# Patient Record
Sex: Male | Born: 1965
Health system: Southern US, Community
[De-identification: ages and names within clinical notes are randomized; demographics above are authoritative.]

## PROBLEM LIST (undated history)

## (undated) ENCOUNTER — Emergency Department (HOSPITAL_COMMUNITY): Discharge status: Against Medical Advice | Payer: Self-pay

## (undated) DIAGNOSIS — E785 Hyperlipidemia, unspecified: Secondary | ICD-10-CM

## (undated) DIAGNOSIS — K760 Fatty (change of) liver, not elsewhere classified: Secondary | ICD-10-CM

## (undated) DIAGNOSIS — Z8489 Family history of other specified conditions: Secondary | ICD-10-CM

## (undated) DIAGNOSIS — I10 Essential (primary) hypertension: Secondary | ICD-10-CM

## (undated) DIAGNOSIS — M109 Gout, unspecified: Secondary | ICD-10-CM

## (undated) HISTORY — DX: Essential (primary) hypertension: I10

## (undated) HISTORY — DX: Hyperlipidemia, unspecified: E78.5

## (undated) HISTORY — DX: Fatty (change of) liver, not elsewhere classified: K76.0

## (undated) HISTORY — PX: OTHER SURGICAL HISTORY: SHX169

## (undated) HISTORY — DX: Gout, unspecified: M10.9

---
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
## 1997-03-07 HISTORY — PX: WISDOM TOOTH EXTRACTION: SHX21

## 2013-09-25 ENCOUNTER — Ambulatory Visit: Payer: Self-pay | Admitting: Podiatrist

## 2015-07-29 ENCOUNTER — Telehealth: Payer: Self-pay | Admitting: Internal Medicine

## 2015-07-29 NOTE — Telephone Encounter (Signed)
Unfortunately, I'm not able to accept any more new patients at this time.  I'm sorry! Thank you!  

## 2015-07-29 NOTE — Telephone Encounter (Signed)
Pt requste to be Dr. Camila Li new pt, [facility], pt stated Joseph Livingston refer him. Advise pt that Dr. Camila Li is not taking new pt at the moment but we will ask. Please advise.

## 2015-07-30 NOTE — Telephone Encounter (Signed)
Pt aware.

## 2015-10-06 ENCOUNTER — Telehealth: Payer: Self-pay

## 2015-10-06 NOTE — Telephone Encounter (Signed)
Unfortunately, I'm not able to accept any more new patients at this time.  I'm sorry! Thank you!  

## 2015-10-06 NOTE — Telephone Encounter (Signed)
Joseph Livingston is calling wanting to know if you would accept him as new patient----has several friends that see you now and recommended you to him---routing to dr plotnikov, please advise if you are ok with this, I will call patient back, thanks

## 2015-10-07 NOTE — Telephone Encounter (Signed)
Advised patient of dr plotnikovs response, patient can call back and choose another doctor here in our office if he wants to, patient would like to think about it and call back if he decides to go with this practice

## 2015-11-13 ENCOUNTER — Telehealth: Payer: Self-pay | Admitting: Internal Medicine

## 2015-11-13 NOTE — Telephone Encounter (Signed)
Ok Thx 

## 2015-11-13 NOTE — Telephone Encounter (Signed)
States that he is a friend of Yetta Barre.  States Dr. Camila Li told Audry Pili when he was last in to see Dr. Camila Li that Dr. Camila Li said he would take him on as a patient.  Please advise.

## 2015-11-16 NOTE — Telephone Encounter (Signed)
Got patient scheduled

## 2015-11-25 ENCOUNTER — Ambulatory Visit (INDEPENDENT_AMBULATORY_CARE_PROVIDER_SITE_OTHER): Payer: Commercial Managed Care - HMO | Admitting: Internal Medicine

## 2015-11-25 ENCOUNTER — Other Ambulatory Visit (INDEPENDENT_AMBULATORY_CARE_PROVIDER_SITE_OTHER): Payer: Commercial Managed Care - HMO

## 2015-11-25 ENCOUNTER — Encounter: Payer: Self-pay | Admitting: Internal Medicine

## 2015-11-25 VITALS — BP 140/80 | HR 79 | Ht 66.0 in | Wt 188.0 lb

## 2015-11-25 DIAGNOSIS — F4321 Adjustment disorder with depressed mood: Secondary | ICD-10-CM

## 2015-11-25 DIAGNOSIS — R7989 Other specified abnormal findings of blood chemistry: Secondary | ICD-10-CM

## 2015-11-25 DIAGNOSIS — E559 Vitamin D deficiency, unspecified: Secondary | ICD-10-CM

## 2015-11-25 DIAGNOSIS — Z0000 Encounter for general adult medical examination without abnormal findings: Secondary | ICD-10-CM

## 2015-11-25 DIAGNOSIS — R972 Elevated prostate specific antigen [PSA]: Secondary | ICD-10-CM

## 2015-11-25 DIAGNOSIS — R945 Abnormal results of liver function studies: Secondary | ICD-10-CM

## 2015-11-25 DIAGNOSIS — I10 Essential (primary) hypertension: Secondary | ICD-10-CM | POA: Diagnosis not present

## 2015-11-25 DIAGNOSIS — E785 Hyperlipidemia, unspecified: Secondary | ICD-10-CM

## 2015-11-25 LAB — BASIC METABOLIC PANEL
BUN: 17 mg/dL (ref 6–23)
CHLORIDE: 95 meq/L — AB (ref 96–112)
CO2: 31 meq/L (ref 19–32)
Calcium: 10.2 mg/dL (ref 8.4–10.5)
Creatinine, Ser: 1.15 mg/dL (ref 0.40–1.50)
GFR: 86.75 mL/min (ref >60.00)
GLUCOSE: 107 mg/dL — AB (ref 70–99)
POTASSIUM: 4.7 meq/L (ref 3.5–5.1)
Sodium: 133 mEq/L — ABNORMAL LOW (ref 135–145)

## 2015-11-25 LAB — HEPATIC FUNCTION PANEL
ALT: 59 U/L — AB (ref 0–53)
AST: 47 U/L — AB (ref 0–37)
Albumin: 4.8 g/dL (ref 3.5–5.2)
Alkaline Phosphatase: 48 U/L (ref 39–117)
BILIRUBIN DIRECT: 0.1 mg/dL (ref 0.0–0.3)
BILIRUBIN TOTAL: 1.2 mg/dL (ref 0.2–1.2)
Total Protein: 8 g/dL (ref 6.0–8.3)

## 2015-11-25 LAB — LIPID PANEL
CHOLESTEROL: 369 mg/dL — AB (ref 0–200)
HDL: 55.7 mg/dL (ref >39.00)
Total CHOL/HDL Ratio: 7
Triglycerides: 433 mg/dL — ABNORMAL HIGH (ref 0.0–149.0)

## 2015-11-25 LAB — CBC WITH DIFFERENTIAL/PLATELET
BASOS PCT: 0.7 % (ref 0.0–3.0)
Basophils Absolute: 0 10*3/uL (ref 0.0–0.1)
EOS PCT: 1.7 % (ref 0.0–5.0)
Eosinophils Absolute: 0.1 10*3/uL (ref 0.0–0.7)
HCT: 41.4 % (ref 39.0–52.0)
Hemoglobin: 14.6 g/dL (ref 13.0–17.0)
LYMPHS ABS: 1.5 10*3/uL (ref 0.7–4.0)
Lymphocytes Relative: 34 % (ref 12.0–46.0)
MCHC: 35.3 g/dL (ref 30.0–36.0)
MCV: 87.6 fl (ref 78.0–100.0)
MONO ABS: 0.4 10*3/uL (ref 0.1–1.0)
MONOS PCT: 9 % (ref 3.0–12.0)
NEUTROS ABS: 2.4 10*3/uL (ref 1.4–7.7)
NEUTROS PCT: 54.6 % (ref 43.0–77.0)
PLATELETS: 236 10*3/uL (ref 150.0–400.0)
RBC: 4.73 Mil/uL (ref 4.22–5.81)
RDW: 13.2 % (ref 11.5–15.5)
WBC: 4.4 10*3/uL (ref 4.0–10.5)

## 2015-11-25 LAB — URINALYSIS
Bilirubin Urine: NEGATIVE
Hgb urine dipstick: NEGATIVE
KETONES UR: NEGATIVE
LEUKOCYTES UA: NEGATIVE
NITRITE: NEGATIVE
PH: 6.5 (ref 5.0–8.0)
SPECIFIC GRAVITY, URINE: 1.01 (ref 1.000–1.030)
Total Protein, Urine: NEGATIVE
UROBILINOGEN UA: 0.2 (ref 0.0–1.0)
Urine Glucose: NEGATIVE

## 2015-11-25 LAB — TSH: TSH: 1.14 u[IU]/mL (ref 0.35–4.50)

## 2015-11-25 LAB — LDL CHOLESTEROL, DIRECT: LDL DIRECT: 225 mg/dL

## 2015-11-25 LAB — PSA: PSA: 9.61 ng/mL — AB (ref 0.10–4.00)

## 2015-11-25 MED ORDER — VITAMIN D3 50 MCG (2000 UT) PO CAPS: 2000.0000 [IU] | ORAL_CAPSULE | Freq: Every day | ORAL | 3 refills | Status: AC

## 2015-11-25 NOTE — Assessment & Plan Note (Signed)
We discussed age appropriate health related issues, including available/recomended screening tests and vaccinations. We discussed a need for adhering to healthy diet and exercise. Labs/EKG were reviewed/ordered. All questions were answered.   

## 2015-11-25 NOTE — Assessment & Plan Note (Signed)
?  vytorin or Crestor related Will re-check

## 2015-11-25 NOTE — Assessment & Plan Note (Signed)
Free psa

## 2015-11-25 NOTE — Assessment & Plan Note (Addendum)
Statin intolerant Severe On Lovaza

## 2015-11-25 NOTE — Progress Notes (Signed)
   Subjective:  Patient ID: Joseph Livingston, male    DOB: Mar 21, 1965  Age: 50 y.o. MRN: [id number]  CC: Establish Care   HPI BORIS ANGLE presents for a well exam - new pt He is grieving his dad's death  No outpatient prescriptions prior to visit.   No facility-administered medications prior to visit.     ROS Review of Systems  Constitutional: Negative for appetite change, fatigue and unexpected weight change.  HENT: Negative for congestion, nosebleeds, sneezing, sore throat and trouble swallowing.   Eyes: Negative for itching and visual disturbance.  Respiratory: Negative for cough.   Cardiovascular: Negative for chest pain, palpitations and leg swelling.  Gastrointestinal: Negative for abdominal distention, blood in stool, diarrhea and nausea.  Genitourinary: Negative for frequency and hematuria.  Musculoskeletal: Negative for back pain, gait problem, joint swelling and neck pain.  Skin: Negative for rash.  Neurological: Negative for dizziness, tremors, speech difficulty and weakness.  Psychiatric/Behavioral: Positive for sleep disturbance. Negative for agitation and dysphoric mood. The patient is not nervous/anxious.     Objective:  BP 140/80   Pulse 79   Ht 5\' 6"  (1.676 m)   Wt 188 lb (85.3 kg)   SpO2 99%   BMI 30.34 kg/m   BP Readings from Last 3 Encounters:  11/25/15 140/80    Wt Readings from Last 3 Encounters:  11/25/15 188 lb (85.3 kg)    Physical Exam  Constitutional: He is oriented to person, place, and time. He appears well-developed. No distress.  NAD  HENT:  Mouth/Throat: Oropharynx is clear and moist.  Eyes: Conjunctivae are normal. Pupils are equal, round, and reactive to light.  Neck: Normal range of motion. No JVD present. No thyromegaly present.  Cardiovascular: Normal rate, regular rhythm, normal heart sounds and intact distal pulses.  Exam reveals no gallop and no friction rub.   No murmur heard. Pulmonary/Chest: Effort normal  and breath sounds normal. No respiratory distress. He has no wheezes. He has no rales. He exhibits no tenderness.  Abdominal: Soft. Bowel sounds are normal. He exhibits no distension and no mass. There is no tenderness. There is no rebound and no guarding.  Musculoskeletal: Normal range of motion. He exhibits no edema or tenderness.  Lymphadenopathy:    He has no cervical adenopathy.  Neurological: He is alert and oriented to person, place, and time. He has normal reflexes. No cranial nerve deficit. He exhibits normal muscle tone. He displays a negative Romberg sign. Coordination and gait normal.  Skin: Skin is warm and dry. No rash noted.  Psychiatric: He has a normal mood and affect. His behavior is normal. Judgment and thought content normal.  Rectal - pt declined  No results found for: WBC, HGB, HCT, PLT, GLUCOSE, CHOL, TRIG, HDL, LDLDIRECT, LDLCALC, ALT, AST, NA, K, CL, CREATININE, BUN, CO2, TSH, PSA, INR, GLUF, HGBA1C, MICROALBUR  Patient was never admitted.  Assessment & Plan:   There are no diagnoses linked to this encounter. I am having Mr. Luiz maintain his TEKTURNA HCT, MELATONIN PO, and omega-3 acid ethyl esters.  Meds ordered this encounter  Medications  . TEKTURNA HCT 300-12.5 MG TABS    Sig: Take 1 tablet by mouth daily.  Marland Kitchen MELATONIN PO    Sig: Take by mouth at bedtime.  Marland Kitchen omega-3 acid ethyl esters (LOVAZA) 1 g capsule    Sig: Take 2 g by mouth 2 (two) times daily.     Follow-up: No Follow-up on file.  Walker Kehr, MD

## 2015-11-25 NOTE — Assessment & Plan Note (Signed)
On Tekturna HCT Labs

## 2015-11-25 NOTE — Progress Notes (Signed)
Pre visit review using our clinic review tool, if applicable. No additional management support is needed unless otherwise documented below in the visit note. 

## 2015-11-25 NOTE — Assessment & Plan Note (Addendum)
Dad died in 2016 Discussed - pt declined help at the moment

## 2015-11-26 ENCOUNTER — Telehealth: Payer: Self-pay

## 2015-11-26 DIAGNOSIS — E559 Vitamin D deficiency, unspecified: Secondary | ICD-10-CM | POA: Insufficient documentation

## 2015-11-26 LAB — VITAMIN D 25 HYDROXY (VIT D DEFICIENCY, FRACTURES): VITD: 22.49 ng/mL — ABNORMAL LOW (ref 30.00–100.00)

## 2015-11-26 MED ORDER — ERGOCALCIFEROL 1.25 MG (50000 UT) PO CAPS: 50000.0000 [IU] | ORAL_CAPSULE | ORAL | 0 refills | Status: DC

## 2015-11-26 NOTE — Telephone Encounter (Signed)
Yes pls Thx 

## 2015-11-26 NOTE — Telephone Encounter (Signed)
Recd call from karen/lab----specimen too old for add-on labs entered----routing to dr plotnikov----do you want to have patient come back and reorder new labs

## 2015-11-26 NOTE — Addendum Note (Signed)
Addended by: Cassandria Anger on: 11/26/2015 08:18 PM   Modules accepted: Orders

## 2015-11-26 NOTE — Assessment & Plan Note (Signed)
start Vit D prescription 50000 iu weekly (Rx emailed to your pharmacy) followed by over-the-counter Vit D 2000 iu daily.  

## 2015-11-27 NOTE — Telephone Encounter (Signed)
Stacy/cma to handle

## 2016-01-27 ENCOUNTER — Encounter: Payer: Self-pay | Admitting: Internal Medicine

## 2016-01-27 ENCOUNTER — Ambulatory Visit (INDEPENDENT_AMBULATORY_CARE_PROVIDER_SITE_OTHER): Payer: Commercial Managed Care - HMO | Admitting: Internal Medicine

## 2016-01-27 ENCOUNTER — Other Ambulatory Visit (INDEPENDENT_AMBULATORY_CARE_PROVIDER_SITE_OTHER): Payer: Commercial Managed Care - HMO

## 2016-01-27 DIAGNOSIS — R972 Elevated prostate specific antigen [PSA]: Secondary | ICD-10-CM

## 2016-01-27 DIAGNOSIS — R7989 Other specified abnormal findings of blood chemistry: Secondary | ICD-10-CM

## 2016-01-27 DIAGNOSIS — E559 Vitamin D deficiency, unspecified: Secondary | ICD-10-CM

## 2016-01-27 DIAGNOSIS — I10 Essential (primary) hypertension: Secondary | ICD-10-CM | POA: Diagnosis not present

## 2016-01-27 DIAGNOSIS — R945 Abnormal results of liver function studies: Secondary | ICD-10-CM

## 2016-01-27 DIAGNOSIS — F4321 Adjustment disorder with depressed mood: Secondary | ICD-10-CM

## 2016-01-27 DIAGNOSIS — F4320 Adjustment disorder, unspecified: Secondary | ICD-10-CM

## 2016-01-27 LAB — HEPATITIS B SURFACE ANTIGEN: Hepatitis B Surface Ag: NEGATIVE

## 2016-01-27 LAB — LDL CHOLESTEROL, DIRECT: LDL DIRECT: 242 mg/dL

## 2016-01-27 LAB — HEPATIC FUNCTION PANEL
ALBUMIN: 5.3 g/dL — AB (ref 3.5–5.2)
ALK PHOS: 63 U/L (ref 39–117)
ALT: 97 U/L — ABNORMAL HIGH (ref 0–53)
AST: 61 U/L — ABNORMAL HIGH (ref 0–37)
BILIRUBIN DIRECT: 0.1 mg/dL (ref 0.0–0.3)
TOTAL PROTEIN: 9 g/dL — AB (ref 6.0–8.3)
Total Bilirubin: 1 mg/dL (ref 0.2–1.2)

## 2016-01-27 LAB — LIPID PANEL
CHOL/HDL RATIO: 7
Cholesterol: 401 mg/dL — ABNORMAL HIGH (ref 0–200)
HDL: 58.2 mg/dL (ref >39.00)

## 2016-01-27 LAB — IBC PANEL
IRON: 104 ug/dL (ref 42–165)
SATURATION RATIOS: 22.9 % (ref 20.0–50.0)
TRANSFERRIN: 325 mg/dL (ref 212.0–360.0)

## 2016-01-27 LAB — HEPATITIS B SURFACE ANTIBODY,QUALITATIVE: Hep B S Ab: NEGATIVE

## 2016-01-27 LAB — HEPATITIS C ANTIBODY: HCV Ab: NEGATIVE

## 2016-01-27 NOTE — Progress Notes (Signed)
Pre visit review using our clinic review tool, if applicable. No additional management support is needed unless otherwise documented below in the visit note. 

## 2016-01-27 NOTE — Assessment & Plan Note (Signed)
Discussed.

## 2016-01-27 NOTE — Assessment & Plan Note (Signed)
Liver US; labs

## 2016-01-27 NOTE — Progress Notes (Signed)
Subjective:  Patient ID: Joseph Livingston, male    DOB: 1965/08/06  Age: 50 y.o. MRN: [id number]  CC: No chief complaint on file.   HPI Joseph Livingston presents for elevated PSA, elevated LFTs, elevated lipids, and vit D def f/u.   Outpatient Medications Prior to Visit  Medication Sig Dispense Refill  . Cholecalciferol (VITAMIN D3) 2000 units capsule Take 1 capsule (2,000 Units total) by mouth daily. 100 capsule 3  . MELATONIN PO Take by mouth at bedtime.    Marland Kitchen omega-3 acid ethyl esters (LOVAZA) 1 g capsule Take 2 g by mouth 2 (two) times daily.    Marisa Severin HCT 300-12.5 MG TABS Take 1 tablet by mouth daily.    . ergocalciferol (VITAMIN D2) 50000 units capsule Take 1 capsule (50,000 Units total) by mouth once a week. 6 capsule 0   No facility-administered medications prior to visit.     ROS Review of Systems  Constitutional: Negative for appetite change, fatigue and unexpected weight change.  HENT: Negative for congestion, nosebleeds, sneezing, sore throat and trouble swallowing.   Eyes: Negative for itching and visual disturbance.  Respiratory: Negative for cough.   Cardiovascular: Negative for chest pain, palpitations and leg swelling.  Gastrointestinal: Negative for abdominal distention, blood in stool, diarrhea and nausea.  Genitourinary: Negative for frequency and hematuria.  Musculoskeletal: Negative for back pain, gait problem, joint swelling and neck pain.  Skin: Negative for rash.  Neurological: Negative for dizziness, tremors, speech difficulty and weakness.  Psychiatric/Behavioral: Negative for agitation, dysphoric mood and sleep disturbance. The patient is not nervous/anxious.     Objective:  BP (!) 140/100   Pulse 87   Temp 98.9 F (37.2 C) (Oral)   Wt 192 lb (87.1 kg)   SpO2 96%   BMI 30.99 kg/m   BP Readings from Last 3 Encounters:  01/27/16 (!) 140/100  11/25/15 140/80    Wt Readings from Last 3 Encounters:  01/27/16 192 lb (87.1 kg)    11/25/15 188 lb (85.3 kg)    Physical Exam  Constitutional: He is oriented to person, place, and time. He appears well-developed. No distress.  NAD  HENT:  Mouth/Throat: Oropharynx is clear and moist.  Eyes: Conjunctivae are normal. Pupils are equal, round, and reactive to light.  Neck: Normal range of motion. No JVD present. No thyromegaly present.  Cardiovascular: Normal rate, regular rhythm, normal heart sounds and intact distal pulses.  Exam reveals no gallop and no friction rub.   No murmur heard. Pulmonary/Chest: Effort normal and breath sounds normal. No respiratory distress. He has no wheezes. He has no rales. He exhibits no tenderness.  Abdominal: Soft. Bowel sounds are normal. He exhibits no distension and no mass. There is no tenderness. There is no rebound and no guarding.  Genitourinary: Rectum normal and prostate normal. Rectal exam shows guaiac negative stool.  Musculoskeletal: Normal range of motion. He exhibits no edema or tenderness.  Lymphadenopathy:    He has no cervical adenopathy.  Neurological: He is alert and oriented to person, place, and time. He has normal reflexes. No cranial nerve deficit. He exhibits normal muscle tone. He displays a negative Romberg sign. Coordination and gait normal.  Skin: Skin is warm and dry. No rash noted.  Psychiatric: He has a normal mood and affect. His behavior is normal. Judgment and thought content normal.    Lab Results  Component Value Date   WBC 4.4 11/25/2015   HGB 14.6 11/25/2015   HCT 41.4 11/25/2015  PLT 236.0 11/25/2015   GLUCOSE 107 (H) 11/25/2015   CHOL 369 (H) 11/25/2015   TRIG (H) 11/25/2015    433.0 Triglyceride is over 400; calculations on Lipids are invalid.   HDL 55.70 11/25/2015   LDLDIRECT 225.0 11/25/2015   ALT 59 (H) 11/25/2015   AST 47 (H) 11/25/2015   NA 133 (L) 11/25/2015   K 4.7 11/25/2015   CL 95 (L) 11/25/2015   CREATININE 1.15 11/25/2015   BUN 17 11/25/2015   CO2 31 11/25/2015   TSH  1.14 11/25/2015   PSA 9.61 (H) 11/25/2015    Patient was never admitted.  Assessment & Plan:   There are no diagnoses linked to this encounter. I have discontinued Mr. Lemberg ergocalciferol. I am also having him maintain his TEKTURNA HCT, MELATONIN PO, omega-3 acid ethyl esters, Vitamin D3, and VIAGRA.  Meds ordered this encounter  Medications  . VIAGRA 100 MG tablet    Sig: as needed.     Follow-up: No Follow-up on file.  Walker Kehr, MD

## 2016-01-27 NOTE — Assessment & Plan Note (Signed)
Free PSA 

## 2016-01-27 NOTE — Assessment & Plan Note (Signed)
Treated On Vit D

## 2016-01-27 NOTE — Assessment & Plan Note (Signed)
On tekturna HCT

## 2016-01-29 LAB — PSA, TOTAL AND FREE
PSA, % FREE: 5 % — AB (ref >25)
PSA, Free: 0.5 ng/mL
PSA, Total: 9.1 ng/mL — ABNORMAL HIGH (ref <=4.0)

## 2016-02-01 ENCOUNTER — Other Ambulatory Visit: Payer: Self-pay | Admitting: Internal Medicine

## 2016-02-01 DIAGNOSIS — R972 Elevated prostate specific antigen [PSA]: Secondary | ICD-10-CM

## 2016-02-01 MED ORDER — EZETIMIBE 10 MG PO TABS
10.0000 mg | ORAL_TABLET | Freq: Every day | ORAL | 3 refills | Status: DC
Start: 2016-02-01 — End: 2016-06-04

## 2016-02-01 MED ORDER — PRAVASTATIN SODIUM 20 MG PO TABS: 20.0000 mg | ORAL_TABLET | Freq: Every day | ORAL | 11 refills | Status: DC

## 2016-02-18 ENCOUNTER — Ambulatory Visit
Admission: RE | Admit: 2016-02-18 | Discharge: 2016-02-18 | Disposition: A | Discharge status: Home / Self Care | Payer: Commercial Managed Care - HMO | Source: Ambulatory Visit | Attending: Internal Medicine | Admitting: Internal Medicine

## 2016-02-24 ENCOUNTER — Encounter: Payer: Self-pay | Admitting: Internal Medicine

## 2016-03-15 DIAGNOSIS — R972 Elevated prostate specific antigen [PSA]: Secondary | ICD-10-CM | POA: Diagnosis not present

## 2016-03-16 ENCOUNTER — Telehealth: Payer: Self-pay | Admitting: Internal Medicine

## 2016-03-16 NOTE — Telephone Encounter (Signed)
Pt aware.

## 2016-03-16 NOTE — Telephone Encounter (Signed)
Pt called asking for advise. Pt stated last ov was 01/2016 with high BP. Pt was wondering if Dr. Camila Li wants to see him back for this problem or wants him to go see the cardiologist for this BP reading: 139/99, 138/95?

## 2016-03-16 NOTE — Telephone Encounter (Signed)
pls see me for ROV Thx

## 2016-03-17 DIAGNOSIS — R972 Elevated prostate specific antigen [PSA]: Secondary | ICD-10-CM | POA: Diagnosis not present

## 2016-03-18 ENCOUNTER — Ambulatory Visit (INDEPENDENT_AMBULATORY_CARE_PROVIDER_SITE_OTHER): Payer: Commercial Managed Care - HMO | Admitting: Internal Medicine

## 2016-03-18 ENCOUNTER — Encounter: Payer: Self-pay | Admitting: Internal Medicine

## 2016-03-18 DIAGNOSIS — R972 Elevated prostate specific antigen [PSA]: Secondary | ICD-10-CM | POA: Diagnosis not present

## 2016-03-18 DIAGNOSIS — E785 Hyperlipidemia, unspecified: Secondary | ICD-10-CM

## 2016-03-18 DIAGNOSIS — R7989 Other specified abnormal findings of blood chemistry: Secondary | ICD-10-CM

## 2016-03-18 DIAGNOSIS — I10 Essential (primary) hypertension: Secondary | ICD-10-CM

## 2016-03-18 DIAGNOSIS — R002 Palpitations: Secondary | ICD-10-CM

## 2016-03-18 DIAGNOSIS — R945 Abnormal results of liver function studies: Secondary | ICD-10-CM

## 2016-03-18 NOTE — Assessment & Plan Note (Signed)
Pravastatin and Zetia Labs

## 2016-03-18 NOTE — Assessment & Plan Note (Signed)
Bx is pending w/Dr Gaynelle Arabian

## 2016-03-18 NOTE — Assessment & Plan Note (Signed)
Labs

## 2016-03-18 NOTE — Progress Notes (Signed)
Pre visit review using our clinic review tool, if applicable. No additional management support is needed unless otherwise documented below in the visit note. 

## 2016-03-18 NOTE — Progress Notes (Signed)
Subjective:  Patient ID: Joseph Livingston, male    DOB: [birth year]  Age: 51 y.o. MRN: [id number]  CC: No chief complaint on file.   HPI WYLER SOUTHWOOD presents for heart racing after exercising x 1 a few days ago F/u elev PSA, elev LFTs  Outpatient Medications Prior to Visit  Medication Sig Dispense Refill  . Cholecalciferol (VITAMIN D3) 2000 units capsule Take 1 capsule (2,000 Units total) by mouth daily. 100 capsule 3  . ezetimibe (ZETIA) 10 MG tablet Take 1 tablet (10 mg total) by mouth daily. 30 tablet 3  . MELATONIN PO Take by mouth at bedtime.    Marland Kitchen omega-3 acid ethyl esters (LOVAZA) 1 g capsule Take 2 g by mouth 2 (two) times daily.    . pravastatin (PRAVACHOL) 20 MG tablet Take 1 tablet (20 mg total) by mouth daily. 30 tablet 11  . TEKTURNA HCT 300-12.5 MG TABS Take 1 tablet by mouth daily.    Marland Kitchen VIAGRA 100 MG tablet as needed.     No facility-administered medications prior to visit.     ROS Review of Systems  Constitutional: Negative for appetite change, fatigue and unexpected weight change.  HENT: Negative for congestion, nosebleeds, sneezing, sore throat and trouble swallowing.   Eyes: Negative for itching and visual disturbance.  Respiratory: Negative for cough.   Cardiovascular: Positive for palpitations. Negative for chest pain and leg swelling.  Gastrointestinal: Negative for abdominal distention, blood in stool, diarrhea and nausea.  Genitourinary: Negative for frequency and hematuria.  Musculoskeletal: Negative for back pain, gait problem, joint swelling and neck pain.  Skin: Negative for rash.  Neurological: Negative for dizziness, tremors, speech difficulty and weakness.  Psychiatric/Behavioral: Negative for agitation, dysphoric mood and sleep disturbance. The patient is not nervous/anxious.     Objective:  BP 138/90   Pulse 89   Wt 190 lb (86.2 kg)   SpO2 96%   BMI 30.67 kg/m   BP Readings from Last 3 Encounters:  03/18/16 138/90  01/27/16  (!) 140/100  11/25/15 140/80    Wt Readings from Last 3 Encounters:  03/18/16 190 lb (86.2 kg)  01/27/16 192 lb (87.1 kg)  11/25/15 188 lb (85.3 kg)    Physical Exam  Constitutional: He is oriented to person, place, and time. He appears well-developed. No distress.  NAD  HENT:  Mouth/Throat: Oropharynx is clear and moist.  Eyes: Conjunctivae are normal. Pupils are equal, round, and reactive to light.  Neck: Normal range of motion. No JVD present. No thyromegaly present.  Cardiovascular: Normal rate, regular rhythm, normal heart sounds and intact distal pulses.  Exam reveals no gallop and no friction rub.   No murmur heard. Pulmonary/Chest: Effort normal and breath sounds normal. No respiratory distress. He has no wheezes. He has no rales. He exhibits no tenderness.  Abdominal: Soft. Bowel sounds are normal. He exhibits no distension and no mass. There is no tenderness. There is no rebound and no guarding.  Musculoskeletal: Normal range of motion. He exhibits no edema or tenderness.  Lymphadenopathy:    He has no cervical adenopathy.  Neurological: He is alert and oriented to person, place, and time. He has normal reflexes. No cranial nerve deficit. He exhibits normal muscle tone. He displays a negative Romberg sign. Coordination and gait normal.  Skin: Skin is warm and dry. No rash noted.  Psychiatric: He has a normal mood and affect. His behavior is normal. Judgment and thought content normal.    Procedure: EKG Indication: chest  pain Impression: NSR. No acute changes.   Lab Results  Component Value Date   WBC 4.4 11/25/2015   HGB 14.6 11/25/2015   HCT 41.4 11/25/2015   PLT 236.0 11/25/2015   GLUCOSE 107 (H) 11/25/2015   CHOL 401 (H) 01/27/2016   TRIG (H) 01/27/2016    406.0 Triglyceride is over 400; calculations on Lipids are invalid.   HDL 58.20 01/27/2016   LDLDIRECT 242.0 01/27/2016   ALT 97 (H) 01/27/2016   AST 61 (H) 01/27/2016   NA 133 (L) 11/25/2015   K 4.7  11/25/2015   CL 95 (L) 11/25/2015   CREATININE 1.15 11/25/2015   BUN 17 11/25/2015   CO2 31 11/25/2015   TSH 1.14 11/25/2015   PSA 9.61 (H) 11/25/2015    US Abdomen Complete  Result Date: 02/18/2016 CLINICAL DATA:  Elevated liver function tests. EXAM: ABDOMEN ULTRASOUND COMPLETE COMPARISON:  None. FINDINGS: Gallbladder: No gallstones or wall thickening visualized. No sonographic Murphy sign noted by sonographer. Common bile duct: Diameter: 3.8 mm which is within normal limits. Liver: No focal lesion identified. Increased echogenicity of hepatic parenchyma is noted consistent with fatty infiltration. IVC: No abnormality visualized. Pancreas: Visualized portion unremarkable. Spleen: Size and appearance within normal limits. Right Kidney: Length: 12 cm. Echogenicity within normal limits. No mass or hydronephrosis visualized. Left Kidney: Length: 10.8 cm. Echogenicity within normal limits. No mass or hydronephrosis visualized. Abdominal aorta: No aneurysm visualized. Other findings: None. IMPRESSION: Increased echogenicity of hepatic parenchyma consistent with fatty infiltration. No other abnormality seen in the abdomen. Electronically Signed   By: Marijo Conception, M.D.   On: 02/18/2016 09:33    Assessment & Plan:   There are no diagnoses linked to this encounter. I am having Mr. Harpenau maintain his TEKTURNA HCT, MELATONIN PO, omega-3 acid ethyl esters, Vitamin D3, VIAGRA, pravastatin, and ezetimibe.  No orders of the defined types were placed in this encounter.    Follow-up: No Follow-up on file.  Walker Kehr, MD

## 2016-03-18 NOTE — Assessment & Plan Note (Signed)
S tachy  EKG Labs Will watch

## 2016-04-01 DIAGNOSIS — R972 Elevated prostate specific antigen [PSA]: Secondary | ICD-10-CM | POA: Diagnosis not present

## 2016-04-28 ENCOUNTER — Ambulatory Visit: Payer: Commercial Managed Care - HMO | Admitting: Internal Medicine

## 2016-06-04 ENCOUNTER — Other Ambulatory Visit: Payer: Self-pay | Admitting: Internal Medicine

## 2016-06-24 ENCOUNTER — Telehealth: Payer: Self-pay | Admitting: Internal Medicine

## 2016-06-24 MED ORDER — TEKTURNA HCT 300-12.5 MG PO TABS: 1.0000 | ORAL_TABLET | Freq: Every day | ORAL | 1 refills | Status: DC

## 2016-06-24 NOTE — Telephone Encounter (Signed)
Med is from a historical provider, okay to refill?

## 2016-06-24 NOTE — Telephone Encounter (Signed)
Pt would like refill of TEKTURNA HCT 300-12.5 MG TABS    [facility] on [address] in [place]

## 2016-06-24 NOTE — Telephone Encounter (Signed)
RX sent, okay per Dr. Alain Marion

## 2016-06-24 NOTE — Telephone Encounter (Signed)
Patient is calling again about this. He states he is out of medication. He thought all this medication was going to transfer when he became establish with Dr. Camila Li. Please advise. Thank you.

## 2016-12-02 ENCOUNTER — Encounter: Payer: Commercial Managed Care - HMO | Admitting: Internal Medicine

## 2016-12-04 ENCOUNTER — Other Ambulatory Visit: Payer: Self-pay | Admitting: Internal Medicine

## 2016-12-09 ENCOUNTER — Ambulatory Visit (INDEPENDENT_AMBULATORY_CARE_PROVIDER_SITE_OTHER): Payer: 59 | Admitting: Internal Medicine

## 2016-12-09 ENCOUNTER — Other Ambulatory Visit (INDEPENDENT_AMBULATORY_CARE_PROVIDER_SITE_OTHER): Payer: 59

## 2016-12-09 ENCOUNTER — Encounter: Payer: Self-pay | Admitting: Internal Medicine

## 2016-12-09 VITALS — BP 130/82 | HR 95 | Temp 98.6°F | Resp 16 | Ht 66.0 in | Wt 190.0 lb

## 2016-12-09 DIAGNOSIS — R972 Elevated prostate specific antigen [PSA]: Secondary | ICD-10-CM | POA: Diagnosis not present

## 2016-12-09 DIAGNOSIS — E559 Vitamin D deficiency, unspecified: Secondary | ICD-10-CM

## 2016-12-09 DIAGNOSIS — Z0000 Encounter for general adult medical examination without abnormal findings: Secondary | ICD-10-CM

## 2016-12-09 DIAGNOSIS — I10 Essential (primary) hypertension: Secondary | ICD-10-CM

## 2016-12-09 DIAGNOSIS — E785 Hyperlipidemia, unspecified: Secondary | ICD-10-CM | POA: Diagnosis not present

## 2016-12-09 DIAGNOSIS — R945 Abnormal results of liver function studies: Secondary | ICD-10-CM

## 2016-12-09 DIAGNOSIS — R7989 Other specified abnormal findings of blood chemistry: Secondary | ICD-10-CM

## 2016-12-09 LAB — LIPID PANEL
CHOL/HDL RATIO: 5
CHOLESTEROL: 280 mg/dL — AB (ref 0–200)
HDL: 61 mg/dL (ref >39.00)
NonHDL: 218.51
Triglycerides: 211 mg/dL — ABNORMAL HIGH (ref 0.0–149.0)
VLDL: 42.2 mg/dL — AB (ref 0.0–40.0)

## 2016-12-09 LAB — CBC WITH DIFFERENTIAL/PLATELET
Basophils Absolute: 0 10*3/uL (ref 0.0–0.1)
Basophils Relative: 0.7 % (ref 0.0–3.0)
EOS PCT: 0.6 % (ref 0.0–5.0)
Eosinophils Absolute: 0 10*3/uL (ref 0.0–0.7)
HCT: 44 % (ref 39.0–52.0)
HEMOGLOBIN: 15.1 g/dL (ref 13.0–17.0)
Lymphocytes Relative: 26.8 % (ref 12.0–46.0)
Lymphs Abs: 1.6 10*3/uL (ref 0.7–4.0)
MCHC: 34.4 g/dL (ref 30.0–36.0)
MCV: 91.4 fl (ref 78.0–100.0)
MONO ABS: 0.4 10*3/uL (ref 0.1–1.0)
MONOS PCT: 6.4 % (ref 3.0–12.0)
Neutro Abs: 3.8 10*3/uL (ref 1.4–7.7)
Neutrophils Relative %: 65.5 % (ref 43.0–77.0)
Platelets: 252 10*3/uL (ref 150.0–400.0)
RBC: 4.82 Mil/uL (ref 4.22–5.81)
RDW: 13 % (ref 11.5–15.5)
WBC: 5.8 10*3/uL (ref 4.0–10.5)

## 2016-12-09 LAB — URINALYSIS
Bilirubin Urine: NEGATIVE
HGB URINE DIPSTICK: NEGATIVE
Ketones, ur: NEGATIVE
Leukocytes, UA: NEGATIVE
NITRITE: NEGATIVE
Specific Gravity, Urine: <=1.005 — AB (ref 1.000–1.030)
Total Protein, Urine: NEGATIVE
URINE GLUCOSE: NEGATIVE
UROBILINOGEN UA: 0.2 (ref 0.0–1.0)
pH: 7 (ref 5.0–8.0)

## 2016-12-09 LAB — HEPATIC FUNCTION PANEL
ALT: 84 U/L — ABNORMAL HIGH (ref 0–53)
AST: 54 U/L — AB (ref 0–37)
Albumin: 5.4 g/dL — ABNORMAL HIGH (ref 3.5–5.2)
Alkaline Phosphatase: 54 U/L (ref 39–117)
BILIRUBIN TOTAL: 1.3 mg/dL — AB (ref 0.2–1.2)
Bilirubin, Direct: 0.2 mg/dL (ref 0.0–0.3)
Total Protein: 9 g/dL — ABNORMAL HIGH (ref 6.0–8.3)

## 2016-12-09 LAB — BASIC METABOLIC PANEL
BUN: 17 mg/dL (ref 6–23)
CALCIUM: 10.8 mg/dL — AB (ref 8.4–10.5)
CO2: 28 mEq/L (ref 19–32)
CREATININE: 1.27 mg/dL (ref 0.40–1.50)
Chloride: 91 mEq/L — ABNORMAL LOW (ref 96–112)
GFR: 77.04 mL/min (ref >60.00)
Glucose, Bld: 99 mg/dL (ref 70–99)
POTASSIUM: 4 meq/L (ref 3.5–5.1)
Sodium: 130 mEq/L — ABNORMAL LOW (ref 135–145)

## 2016-12-09 LAB — LDL CHOLESTEROL, DIRECT: LDL DIRECT: 181 mg/dL

## 2016-12-09 LAB — TSH: TSH: 0.95 u[IU]/mL (ref 0.35–4.50)

## 2016-12-09 MED ORDER — VIAGRA 100 MG PO TABS: 100.0000 mg | ORAL_TABLET | ORAL | 11 refills | Status: DC | PRN

## 2016-12-09 NOTE — Assessment & Plan Note (Signed)
We discussed age appropriate health related issues, including available/recomended screening tests and vaccinations. We discussed a need for adhering to healthy diet and exercise. Labs were ordered to be later reviewed . All questions were answered.   

## 2016-12-09 NOTE — Assessment & Plan Note (Signed)
Ref to Urol

## 2016-12-09 NOTE — Assessment & Plan Note (Signed)
Vit D 

## 2016-12-09 NOTE — Progress Notes (Signed)
Subjective:  Patient ID: Joseph Livingston, male    DOB: July 04, 1965  Age: 51 y.o. MRN: [id number]  CC: No chief complaint on file.   HPI Joseph Livingston presents for a well exam C/o ED at times F/u elevated PSA  Outpatient Medications Prior to Visit  Medication Sig Dispense Refill  . Cholecalciferol (VITAMIN D3) 2000 units capsule Take 1 capsule (2,000 Units total) by mouth daily. 100 capsule 3  . ezetimibe (ZETIA) 10 MG tablet TAKE 1 TABLET (10 MG TOTAL) BY MOUTH DAILY. 30 tablet 11  . MELATONIN PO Take by mouth at bedtime.    Marland Kitchen omega-3 acid ethyl esters (LOVAZA) 1 g capsule Take 2 g by mouth 2 (two) times daily.    . pravastatin (PRAVACHOL) 20 MG tablet Take 1 tablet (20 mg total) by mouth daily. 30 tablet 11  . TEKTURNA HCT 300-12.5 MG TABS Take 1 tablet by mouth daily. 30 each 1  . VIAGRA 100 MG tablet as needed.     No facility-administered medications prior to visit.     ROS Review of Systems  Constitutional: Negative for appetite change, fatigue and unexpected weight change.  HENT: Negative for congestion, nosebleeds, sneezing, sore throat and trouble swallowing.   Eyes: Negative for itching and visual disturbance.  Respiratory: Negative for cough.   Cardiovascular: Negative for chest pain, palpitations and leg swelling.  Gastrointestinal: Negative for abdominal distention, blood in stool, diarrhea and nausea.  Genitourinary: Negative for frequency and hematuria.  Musculoskeletal: Negative for back pain, gait problem, joint swelling and neck pain.  Skin: Negative for rash.  Neurological: Negative for dizziness, tremors, speech difficulty and weakness.  Psychiatric/Behavioral: Negative for agitation, dysphoric mood and sleep disturbance. The patient is not nervous/anxious.     Objective:  BP 130/82 (BP Location: Right Arm, Patient Position: Sitting, Cuff Size: Large)   Pulse 95   Temp 98.6 F (37 C) (Oral)   Resp 16   Ht 5\' 6"  (1.676 m)   Wt 190 lb (86.2  kg)   SpO2 98%   BMI 30.67 kg/m   BP Readings from Last 3 Encounters:  12/09/16 130/82  03/18/16 138/90  01/27/16 (!) 140/100    Wt Readings from Last 3 Encounters:  12/09/16 190 lb (86.2 kg)  03/18/16 190 lb (86.2 kg)  01/27/16 192 lb (87.1 kg)    Physical Exam  Constitutional: He is oriented to person, place, and time. He appears well-developed. No distress.  NAD  HENT:  Mouth/Throat: Oropharynx is clear and moist.  Eyes: Pupils are equal, round, and reactive to light. Conjunctivae are normal.  Neck: Normal range of motion. No JVD present. No thyromegaly present.  Cardiovascular: Normal rate, regular rhythm, normal heart sounds and intact distal pulses.  Exam reveals no gallop and no friction rub.   No murmur heard. Pulmonary/Chest: Effort normal and breath sounds normal. No respiratory distress. He has no wheezes. He has no rales. He exhibits no tenderness.  Abdominal: Soft. Bowel sounds are normal. He exhibits no distension and no mass. There is no tenderness. There is no rebound and no guarding.  Musculoskeletal: Normal range of motion. He exhibits no edema or tenderness.  Lymphadenopathy:    He has no cervical adenopathy.  Neurological: He is alert and oriented to person, place, and time. He has normal reflexes. No cranial nerve deficit. He exhibits normal muscle tone. He displays a negative Romberg sign. Coordination and gait normal.  Skin: Skin is warm and dry. No rash noted.  Psychiatric:  He has a normal mood and affect. His behavior is normal. Judgment and thought content normal.    Lab Results  Component Value Date   WBC 4.4 11/25/2015   HGB 14.6 11/25/2015   HCT 41.4 11/25/2015   PLT 236.0 11/25/2015   GLUCOSE 107 (H) 11/25/2015   CHOL 401 (H) 01/27/2016   TRIG (H) 01/27/2016    406.0 Triglyceride is over 400; calculations on Lipids are invalid.   HDL 58.20 01/27/2016   LDLDIRECT 242.0 01/27/2016   ALT 97 (H) 01/27/2016   AST 61 (H) 01/27/2016   NA 133  (L) 11/25/2015   K 4.7 11/25/2015   CL 95 (L) 11/25/2015   CREATININE 1.15 11/25/2015   BUN 17 11/25/2015   CO2 31 11/25/2015   TSH 1.14 11/25/2015   PSA 9.61 (H) 11/25/2015    US Abdomen Complete  Result Date: 02/18/2016 CLINICAL DATA:  Elevated liver function tests. EXAM: ABDOMEN ULTRASOUND COMPLETE COMPARISON:  None. FINDINGS: Gallbladder: No gallstones or wall thickening visualized. No sonographic Murphy sign noted by sonographer. Common bile duct: Diameter: 3.8 mm which is within normal limits. Liver: No focal lesion identified. Increased echogenicity of hepatic parenchyma is noted consistent with fatty infiltration. IVC: No abnormality visualized. Pancreas: Visualized portion unremarkable. Spleen: Size and appearance within normal limits. Right Kidney: Length: 12 cm. Echogenicity within normal limits. No mass or hydronephrosis visualized. Left Kidney: Length: 10.8 cm. Echogenicity within normal limits. No mass or hydronephrosis visualized. Abdominal aorta: No aneurysm visualized. Other findings: None. IMPRESSION: Increased echogenicity of hepatic parenchyma consistent with fatty infiltration. No other abnormality seen in the abdomen. Electronically Signed   By: Marijo Conception, M.D.   On: 02/18/2016 09:33    Assessment & Plan:   There are no diagnoses linked to this encounter. I am having Joseph Livingston maintain his MELATONIN PO, omega-3 acid ethyl esters, Vitamin D3, VIAGRA, pravastatin, TEKTURNA HCT, and ezetimibe.  No orders of the defined types were placed in this encounter.    Follow-up: No Follow-up on file.  Walker Kehr, MD

## 2016-12-11 ENCOUNTER — Other Ambulatory Visit: Payer: Self-pay | Admitting: Internal Medicine

## 2016-12-11 DIAGNOSIS — R945 Abnormal results of liver function studies: Principal | ICD-10-CM

## 2016-12-11 DIAGNOSIS — R7989 Other specified abnormal findings of blood chemistry: Secondary | ICD-10-CM

## 2016-12-12 NOTE — Assessment & Plan Note (Signed)
The best we can do due to elevated LFTs

## 2016-12-12 NOTE — Assessment & Plan Note (Signed)
Worse  GI ref 

## 2016-12-13 ENCOUNTER — Encounter: Payer: Self-pay | Admitting: Gastroenterology

## 2017-01-19 ENCOUNTER — Other Ambulatory Visit: Payer: Self-pay | Admitting: Internal Medicine

## 2017-01-19 NOTE — Telephone Encounter (Signed)
Pt called for a refill of this  Please advise

## 2017-02-09 ENCOUNTER — Other Ambulatory Visit: Payer: Self-pay | Admitting: Internal Medicine

## 2017-02-22 ENCOUNTER — Ambulatory Visit: Payer: 59 | Admitting: Gastroenterology

## 2017-04-11 ENCOUNTER — Ambulatory Visit: Payer: 59 | Admitting: Gastroenterology

## 2017-05-05 DIAGNOSIS — R972 Elevated prostate specific antigen [PSA]: Secondary | ICD-10-CM | POA: Diagnosis not present

## 2017-05-10 DIAGNOSIS — R972 Elevated prostate specific antigen [PSA]: Secondary | ICD-10-CM | POA: Diagnosis not present

## 2017-05-16 ENCOUNTER — Ambulatory Visit: Payer: 59 | Admitting: Gastroenterology

## 2017-05-31 ENCOUNTER — Ambulatory Visit: Payer: 59 | Admitting: Physician Assistant

## 2017-06-01 DIAGNOSIS — R972 Elevated prostate specific antigen [PSA]: Secondary | ICD-10-CM | POA: Diagnosis not present

## 2017-06-21 ENCOUNTER — Ambulatory Visit (INDEPENDENT_AMBULATORY_CARE_PROVIDER_SITE_OTHER): Payer: 59

## 2017-06-21 ENCOUNTER — Ambulatory Visit (INDEPENDENT_AMBULATORY_CARE_PROVIDER_SITE_OTHER): Payer: 59 | Admitting: Podiatry

## 2017-06-21 ENCOUNTER — Encounter: Payer: Self-pay | Admitting: Podiatry

## 2017-06-21 ENCOUNTER — Ambulatory Visit: Payer: 59 | Admitting: Physician Assistant

## 2017-06-21 VITALS — BP 164/104 | HR 88 | Resp 16

## 2017-06-21 DIAGNOSIS — M2012 Hallux valgus (acquired), left foot: Secondary | ICD-10-CM | POA: Diagnosis not present

## 2017-06-21 DIAGNOSIS — M779 Enthesopathy, unspecified: Secondary | ICD-10-CM | POA: Diagnosis not present

## 2017-06-21 DIAGNOSIS — M2011 Hallux valgus (acquired), right foot: Secondary | ICD-10-CM

## 2017-06-21 DIAGNOSIS — M1000 Idiopathic gout, unspecified site: Secondary | ICD-10-CM | POA: Diagnosis not present

## 2017-06-21 MED ORDER — TRIAMCINOLONE ACETONIDE 10 MG/ML IJ SUSP
10.0000 mg | Freq: Once | INTRAMUSCULAR | Status: AC
  Administered 2017-06-21: 10 mg

## 2017-06-21 NOTE — Patient Instructions (Signed)

## 2017-06-21 NOTE — Progress Notes (Signed)
Subjective:   Patient ID: Joseph Livingston, male   DOB: 52 y.o.   MRN: [id number]   HPI Patient presents with painful bunion deformity left over right and works on cement floors for numerous hours at [employer].  Patient states that it is very painful on the bunion site left and it makes it hard for him to wear his steel toe shoes patient does not smoke likes to be active   Review of Systems  All other systems reviewed and are negative.       Objective:  Physical Exam  Constitutional: He appears well-developed and well-nourished.  Cardiovascular: Intact distal pulses.  Pulmonary/Chest: Effort normal.  Musculoskeletal: Normal range of motion.  Neurological: He is alert.  Skin: Skin is warm.  Nursing note and vitals reviewed.   Neurovascular status intact muscle strength is adequate range of motion within normal limits with patient found to have large nodular deformity first metatarsal left with inflammation fluid buildup and structural changes.  Patient's right is nowhere near to the degree of the left foot patient was found to have good digital perfusion well oriented x3     Assessment:  Inflammatory changes of the first MPJ left with structural bunion with possible cyst formation around the first metatarsal     Plan:  H&P condition reviewed and I do think that ultimately it will require excision of mass with some type of bunionectomy and most likely not an osteotomy based on the x-rays.  Patient wants to try conservative first and I did do a capsular injection first MPJ 3 mg Kenalog 5 mm Xylocaine to see if I can treat the area and advised on wider shoes and reappoint in 3 weeks and will again long-term have the more invasive procedure done  X-rays indicate there is structural changes around the first metatarsal left over right with what appears to be tissue irritation occurring

## 2017-07-05 ENCOUNTER — Other Ambulatory Visit (INDEPENDENT_AMBULATORY_CARE_PROVIDER_SITE_OTHER): Payer: 59

## 2017-07-05 ENCOUNTER — Ambulatory Visit: Payer: 59 | Admitting: Nurse Practitioner

## 2017-07-05 ENCOUNTER — Encounter: Payer: Self-pay | Admitting: Nurse Practitioner

## 2017-07-05 VITALS — BP 142/86 | HR 66 | Ht 66.0 in | Wt 194.4 lb

## 2017-07-05 DIAGNOSIS — R945 Abnormal results of liver function studies: Secondary | ICD-10-CM | POA: Diagnosis not present

## 2017-07-05 DIAGNOSIS — R7989 Other specified abnormal findings of blood chemistry: Secondary | ICD-10-CM

## 2017-07-05 DIAGNOSIS — Z1211 Encounter for screening for malignant neoplasm of colon: Secondary | ICD-10-CM

## 2017-07-05 LAB — HEPATIC FUNCTION PANEL
ALBUMIN: 5.1 g/dL (ref 3.5–5.2)
ALK PHOS: 49 U/L (ref 39–117)
ALT: 94 U/L — ABNORMAL HIGH (ref 0–53)
AST: 56 U/L — AB (ref 0–37)
BILIRUBIN DIRECT: 0.2 mg/dL (ref 0.0–0.3)
Total Bilirubin: 1.4 mg/dL — ABNORMAL HIGH (ref 0.2–1.2)
Total Protein: 8.4 g/dL — ABNORMAL HIGH (ref 6.0–8.3)

## 2017-07-05 LAB — FERRITIN: Ferritin: 302.3 ng/mL (ref 22.0–322.0)

## 2017-07-05 MED ORDER — NA SULFATE-K SULFATE-MG SULF 17.5-3.13-1.6 GM/177ML PO SOLN: ORAL | 0 refills | Status: DC

## 2017-07-05 NOTE — Patient Instructions (Signed)
If you are age 51 or older, your body mass index should be between 23-30. Your Body mass index is 31.38 kg/m. If this is out of the aforementioned range listed, please consider follow up with your Primary Care Provider.  If you are age 20 or younger, your body mass index should be between 19-25. Your Body mass index is 31.38 kg/m. If this is out of the aformentioned range listed, please consider follow up with your Primary Care Provider.   You have been scheduled for a colonoscopy. Please follow written instructions given to you at your visit today.  Please pick up your prep supplies at the pharmacy within the next 1-3 days. If you use inhalers (even only as needed), please bring them with you on the day of your procedure. Your physician has requested that you go to www.startemmi.com and enter the access code given to you at your visit today. This web site gives a general overview about your procedure. However, you should still follow specific instructions given to you by our office regarding your preparation for the procedure.  Your provider has requested that you go to the basement level for lab work before leaving today. Press "B" on the elevator. The lab is located at the first door on the left as you exit the elevator.  Your provider has requested that you go to the basement level for lab work on August 16, 2017.  Continue daily Aspirin but no other anti-inflammatories.  STOP ALL Supplements.  STOP Alcohol.  Thank you for choosing me and [facility].   Tye Savoy, NP

## 2017-07-05 NOTE — Progress Notes (Signed)
Chief Complaint: elevated LFTs  Referring Provider:     Lew Dawes, MD   ASSESSMENT AND PLAN;   32. 52 yo male with chronic transaminitis, < 1.5 x normal.  Consumes ETOH but doesn't given history of heavy use and pattern and transaminits not c/w with ETOH anyway. Typically his ALT has been 80-90 and AST in 50's. Tbili elevated but predominantly indirect.  -Transaminitis could be statin related and / or from fatty liver Livingston. Since levels are less than 1.5 x normal will not hold statin for now. First would ask that he stop nutritional supplements and ETOH for the next 6-8 weeks. He can continue tumeric and melatonin for time being.  -repeat liver chemistries in 6-8 weeks.  -In the interim will obtain labs for autoimmune / genetic markers of  chronic liver Livingston. Viral hep B, C are negative.  -continue to work on lowering triglycerides.   2. Colon cancer screening He has Cologuard kit at home.  I did explain that a positive Cologuard result would necessitate a colonoscopy. After thinking more about it patient would like to proceed with a colonoscopy in lieu of Colguard.  -Patient will be scheduled for a screening colonoscopy with possible polypectomy.  The risks and benefits of the procedure were discussed and the patient agrees to proceed.    HPI:    Patient is a 52 yo male referred by Dr. Alain Marion for abnormal liver labs. Patient has been told liver enzymes were high since starting cholesterol medication several years ago. U/S in 2017 showed fatty liver. HCV and HBV studies negative. He drinks a beer or other ETOH beverage most weekday nights.. On the weekend he may have anywhere from 3 to 5 drinks on Sat and again Sunday. No Joseph Livingston. No illicit drug use. He takes tumeric, a prostate supplement, Mg+, melatonin, valerian root (sometimes), syntha-6 (whey protein). Also takes baby asa daily in addition to other home medications.  Joseph Livingston feels well. He has no  abdominal pain, weight loss, unusual fatigue, pruritis,  bowel changes, blood in stool. No known Joseph Livingston of liver Livingston.     Past Medical History:  Diagnosis Date  . Hypertension     Past Surgical History:  Procedure Laterality Date  . KNEE SURGERY Right 1983   Family History  Problem Relation Age of Onset  . Hypertension Mother   . Graves' Livingston Mother   . Hyperlipidemia Mother   . Heart failure Father   . Hypertension Father   . Prostate cancer Maternal Uncle    Social History   Tobacco Use  . Smoking status: Never Smoker  . Smokeless tobacco: Never Used  Substance Use Topics  . Alcohol use: Yes  . Drug use: No   Current Outpatient Medications  Medication Sig Dispense Refill  . Cholecalciferol (VITAMIN D3) 2000 units capsule Take 1 capsule (2,000 Units total) by mouth daily. 100 capsule 3  . ezetimibe (ZETIA) 10 MG tablet TAKE 1 TABLET (10 MG TOTAL) BY MOUTH DAILY. 30 tablet 11  . MELATONIN PO Take by mouth at bedtime.    Marland Kitchen omega-3 acid ethyl esters (LOVAZA) 1 g capsule Take 2 g by mouth 2 (two) times daily.    . pravastatin (PRAVACHOL) 20 MG tablet TAKE 1 TABLET BY MOUTH ONCE DAILY 30 tablet 8  . TEKTURNA HCT 300-12.5 MG TABS TAKE 1 TABLET BY MOUTH EVERY MORNING 30 each 5  . VIAGRA 100 MG tablet Take 1 tablet (100 mg total) by mouth  as needed. 10 tablet 11   No current facility-administered medications for this visit.    Allergies  Allergen Reactions  . Lisinopril Swelling    Swollen lips  . Crestor [Rosuvastatin Calcium] Rash    Review of Systems: All systems reviewed and negative except where noted in HPI.    Physical Exam:    Wt Readings from Last 3 Encounters:  07/05/17 194 lb 6.4 oz (88.2 kg)  12/09/16 190 lb (86.2 kg)  03/18/16 190 lb (86.2 kg)    BP (!) 142/86   Pulse 66   Ht '5\' 6"'  (1.676 m)   Wt 194 lb 6.4 oz (88.2 kg)   SpO2 99%   BMI 31.38 kg/m  Constitutional:  Well-developed, black male in no acute distress. Psychiatric: Normal mood  and affect. Behavior is normal. EENT: Pupils normal.  Conjunctivae are normal. No scleral icterus. Neck supple.  Cardiovascular: Normal rate, regular rhythm. No edema Pulmonary/chest: Effort normal and breath sounds normal. No wheezing, rales or rhonchi. Abdominal: Soft, nondistended. Nontender. Bowel sounds active throughout. There are no masses palpable. No hepatomegaly. Neurological: Alert and oriented to person place and time. Skin: Skin is warm and dry. No rashes noted.  Joseph Savoy, NP  07/05/2017, 9:48 AM  Joseph Livingston, Joseph Lacks, MD

## 2017-07-06 DIAGNOSIS — R972 Elevated prostate specific antigen [PSA]: Secondary | ICD-10-CM | POA: Diagnosis not present

## 2017-07-06 LAB — CERULOPLASMIN: CERULOPLASMIN: 30 mg/dL (ref 18–36)

## 2017-07-06 LAB — ALPHA-1-ANTITRYPSIN: A1 ANTITRYPSIN SER: 111 mg/dL (ref 83–199)

## 2017-07-07 LAB — MITOCHONDRIAL ANTIBODIES: Mitochondrial M2 Ab, IgG: <=20 U

## 2017-07-07 LAB — ANA: ANA: NEGATIVE

## 2017-07-08 LAB — ANTI-SMOOTH MUSCLE ANTIBODY, IGG: Actin (Smooth Muscle) Antibody (IGG): <20 U (ref <20)

## 2017-07-09 ENCOUNTER — Encounter: Payer: Self-pay | Admitting: Nurse Practitioner

## 2017-07-10 NOTE — Progress Notes (Signed)
Agree with assessment and plan as outlined. I suspect his transaminitis is related to fatty liver given workup done thus far. He should completely abstain from alcohol at this point given fatty liver and ongoing elevation of liver enzymes. Recommend he have LFTs repeated in 3 months to see if this improves with alcohol cessation, although agree that his pattern is not consistent with alcohol use. Of note, when these repeat labs are drawn please have him get a TIBC to ensure iron sat okay.

## 2017-07-12 ENCOUNTER — Ambulatory Visit: Payer: 59 | Admitting: Podiatry

## 2017-07-12 ENCOUNTER — Encounter: Payer: Self-pay | Admitting: Podiatry

## 2017-07-12 ENCOUNTER — Telehealth: Payer: Self-pay | Admitting: Podiatry

## 2017-07-12 DIAGNOSIS — M2012 Hallux valgus (acquired), left foot: Secondary | ICD-10-CM | POA: Diagnosis not present

## 2017-07-12 DIAGNOSIS — M779 Enthesopathy, unspecified: Secondary | ICD-10-CM

## 2017-07-12 DIAGNOSIS — M2011 Hallux valgus (acquired), right foot: Secondary | ICD-10-CM | POA: Diagnosis not present

## 2017-07-12 DIAGNOSIS — M7750 Other enthesopathy of unspecified foot: Secondary | ICD-10-CM | POA: Diagnosis not present

## 2017-07-12 DIAGNOSIS — M1000 Idiopathic gout, unspecified site: Secondary | ICD-10-CM

## 2017-07-12 NOTE — Progress Notes (Signed)
Subjective:   Patient ID: Joseph Livingston, male   DOB: 52 y.o.   MRN: [id number]   HPI Patient presents stating it is still enlarged around that area but it does not bother me like it did and I do have moderate depression of the arch and pain in my feet with walking especially on cement floors   ROS      Objective:  Physical Exam  Neurovascular status intact with continued prominence around the first metatarsal head left over right with significant reduction of discomfort when palpated.  Also has moderate flatfoot deformity with discomfort along the plantar fascial band     Assessment:  Probability for some form of cyst or soft tissue mass around the first metatarsal head left with structural bunion deformity also that overall right now is doing pretty well with symptom-free along with tendinitis of the feet secondary to foot structure     Plan:  H&P condition reviewed and were just can watch it since it feels a lot better but ultimately it will probably require excision of mass with probable bunion correction.  He wants to hold off on surgery currently due to his work schedule but hopefully he will be able to get this done in the near future.  I then went ahead and I scanned him for customized orthotics to reduce plantar pressure and I am creating a 15 mm deep heel seat to reduce the stress on the heel and take pressure off the plantar fascial tendon

## 2017-07-12 NOTE — Telephone Encounter (Signed)
Called pt with benefit information for orthotics and pt would like to proceed.

## 2017-07-14 ENCOUNTER — Other Ambulatory Visit: Payer: Self-pay | Admitting: Urology

## 2017-07-14 DIAGNOSIS — R972 Elevated prostate specific antigen [PSA]: Secondary | ICD-10-CM

## 2017-07-15 ENCOUNTER — Other Ambulatory Visit: Payer: Self-pay | Admitting: Internal Medicine

## 2017-07-18 ENCOUNTER — Other Ambulatory Visit: Payer: Self-pay

## 2017-07-18 DIAGNOSIS — R7989 Other specified abnormal findings of blood chemistry: Secondary | ICD-10-CM

## 2017-07-18 DIAGNOSIS — R945 Abnormal results of liver function studies: Principal | ICD-10-CM

## 2017-07-19 ENCOUNTER — Telehealth: Payer: Self-pay | Admitting: Nurse Practitioner

## 2017-07-19 ENCOUNTER — Other Ambulatory Visit: Payer: Self-pay

## 2017-07-19 DIAGNOSIS — R945 Abnormal results of liver function studies: Principal | ICD-10-CM

## 2017-07-19 DIAGNOSIS — R7989 Other specified abnormal findings of blood chemistry: Secondary | ICD-10-CM

## 2017-07-19 NOTE — Telephone Encounter (Signed)
Copy printed of the labs ordered by Tye Savoy on 07/05/17 and mailed to the patient.

## 2017-07-19 NOTE — Telephone Encounter (Signed)
Patient wanting to know if he can get a copy of his lab results from 5.1.19 mailed to him.

## 2017-07-25 ENCOUNTER — Ambulatory Visit
Admission: RE | Admit: 2017-07-25 | Discharge: 2017-07-25 | Disposition: A | Discharge status: Home / Self Care | Payer: 59 | Source: Ambulatory Visit | Attending: Urology | Admitting: Urology

## 2017-07-25 DIAGNOSIS — R972 Elevated prostate specific antigen [PSA]: Secondary | ICD-10-CM | POA: Diagnosis not present

## 2017-07-25 MED ORDER — GADOBENATE DIMEGLUMINE 529 MG/ML IV SOLN
17.0000 mL | Freq: Once | INTRAVENOUS | Status: AC | PRN
  Administered 2017-07-25: 17 mL via INTRAVENOUS

## 2017-08-08 ENCOUNTER — Other Ambulatory Visit: Payer: 59 | Admitting: Orthotics

## 2017-08-24 ENCOUNTER — Encounter: Payer: Self-pay | Admitting: Gastroenterology

## 2017-09-01 ENCOUNTER — Ambulatory Visit (INDEPENDENT_AMBULATORY_CARE_PROVIDER_SITE_OTHER): Payer: 59 | Admitting: Podiatry

## 2017-09-01 ENCOUNTER — Encounter: Payer: Self-pay | Admitting: Podiatry

## 2017-09-01 DIAGNOSIS — M2012 Hallux valgus (acquired), left foot: Secondary | ICD-10-CM | POA: Diagnosis not present

## 2017-09-01 DIAGNOSIS — M2011 Hallux valgus (acquired), right foot: Secondary | ICD-10-CM | POA: Diagnosis not present

## 2017-09-01 DIAGNOSIS — M779 Enthesopathy, unspecified: Secondary | ICD-10-CM

## 2017-09-01 MED ORDER — TRIAMCINOLONE ACETONIDE 10 MG/ML IJ SUSP
10.0000 mg | Freq: Once | INTRAMUSCULAR | Status: AC
  Administered 2017-09-01: 10 mg

## 2017-09-01 NOTE — Progress Notes (Signed)
Subjective:   Patient ID: Joseph Livingston, male   DOB: 52 y.o.   MRN: [id number]   HPI Patient presents stating that he is getting problems again in his left heel and he needs his orthotics redone   ROS      Objective:  Physical Exam  Neurovascular status intact with exquisite discomfort heel left with orthotics which need to be remolded     Assessment:  Acute plantar fasciitis left inflammation noted     Plan:  H&P injected the left plantar fascia 3 mg Kenalog 5 mg Xylocaine and advised for new orthotics which she is scanned for today

## 2017-09-04 ENCOUNTER — Encounter: Payer: 59 | Admitting: Gastroenterology

## 2017-09-18 ENCOUNTER — Ambulatory Visit: Payer: 59 | Admitting: Orthotics

## 2017-09-18 DIAGNOSIS — M2012 Hallux valgus (acquired), left foot: Secondary | ICD-10-CM

## 2017-09-18 DIAGNOSIS — M2011 Hallux valgus (acquired), right foot: Secondary | ICD-10-CM

## 2017-09-18 DIAGNOSIS — M779 Enthesopathy, unspecified: Secondary | ICD-10-CM

## 2017-09-18 DIAGNOSIS — H524 Presbyopia: Secondary | ICD-10-CM | POA: Diagnosis not present

## 2017-09-18 NOTE — Progress Notes (Signed)

## 2017-10-09 ENCOUNTER — Telehealth: Payer: Self-pay

## 2017-10-09 NOTE — Telephone Encounter (Signed)
Called and asked pt to go to the lab for f/u lab work before appt on 10-27-17 with Dr. Havery Moros. Orders entered.

## 2017-10-13 ENCOUNTER — Encounter: Payer: Self-pay | Admitting: Podiatry

## 2017-10-13 ENCOUNTER — Ambulatory Visit (INDEPENDENT_AMBULATORY_CARE_PROVIDER_SITE_OTHER): Payer: 59

## 2017-10-13 ENCOUNTER — Other Ambulatory Visit: Payer: Self-pay | Admitting: Podiatry

## 2017-10-13 ENCOUNTER — Ambulatory Visit: Payer: 59 | Admitting: Podiatry

## 2017-10-13 DIAGNOSIS — M79675 Pain in left toe(s): Secondary | ICD-10-CM

## 2017-10-13 DIAGNOSIS — M7752 Other enthesopathy of left foot: Secondary | ICD-10-CM

## 2017-10-13 DIAGNOSIS — M109 Gout, unspecified: Secondary | ICD-10-CM

## 2017-10-13 DIAGNOSIS — M779 Enthesopathy, unspecified: Secondary | ICD-10-CM

## 2017-10-13 MED ORDER — TRIAMCINOLONE ACETONIDE 10 MG/ML IJ SUSP
10.0000 mg | Freq: Once | INTRAMUSCULAR | Status: AC
  Administered 2017-10-13: 10 mg

## 2017-10-14 NOTE — Progress Notes (Signed)
Subjective:   Patient ID: Joseph Livingston, male   DOB: 52 y.o.   MRN: [id number]   HPI Patient presents stating this bunion is gotten bad again and it just gets so sore when I try to wear my safety shoes   ROS      Objective:  Physical Exam  Neurovascular status intact with inflammation pain around the first metatarsal joint left with fluid buildup and what appears to be bone prominence     Assessment:  HAV deformity left with inflammatory capsulitis and possibility for gout     Plan:  H&P conditions reviewed and at this point I am sending him for blood work and also I did inject around the first MPJ left 3 mg Kenalog 5 mg Xylocaine is scheduled for surgery in November but I want to get the results of blood work and see her response to medication  X-ray indicates it mostly inflammation around this area with mild structural deformity

## 2017-10-17 LAB — ANA, IFA COMPREHENSIVE PANEL
ANA: NEGATIVE
DS DNA AB: 1 [IU]/mL
ENA SM Ab Ser-aCnc: <1 AI
SM/RNP: NEGATIVE AI
SSA (RO) (ENA) ANTIBODY, IGG: NEGATIVE AI
SSB (La) (ENA) Antibody, IgG: <1 AI
Scleroderma (Scl-70) (ENA) Antibody, IgG: <1 AI

## 2017-10-17 LAB — SEDIMENTATION RATE: Sed Rate: 39 mm/h — ABNORMAL HIGH (ref 0–20)

## 2017-10-17 LAB — RHEUMATOID FACTOR: Rhuematoid fact SerPl-aCnc: <14 IU/mL (ref <14)

## 2017-10-17 LAB — URIC ACID: URIC ACID, SERUM: 8.8 mg/dL — AB (ref 4.0–8.0)

## 2017-10-17 LAB — C-REACTIVE PROTEIN: CRP: 12.7 mg/L — ABNORMAL HIGH (ref <8.0)

## 2017-10-18 ENCOUNTER — Ambulatory Visit: Payer: 59 | Admitting: Podiatry

## 2017-10-18 ENCOUNTER — Encounter: Payer: Self-pay | Admitting: Podiatry

## 2017-10-18 DIAGNOSIS — M779 Enthesopathy, unspecified: Secondary | ICD-10-CM

## 2017-10-18 DIAGNOSIS — M109 Gout, unspecified: Secondary | ICD-10-CM

## 2017-10-18 DIAGNOSIS — M7752 Other enthesopathy of left foot: Secondary | ICD-10-CM

## 2017-10-18 MED ORDER — TRIAMCINOLONE ACETONIDE 10 MG/ML IJ SUSP
10.0000 mg | Freq: Once | INTRAMUSCULAR | Status: AC
  Administered 2017-10-18: 10 mg

## 2017-10-18 MED ORDER — ALLOPURINOL 100 MG PO TABS: 100.0000 mg | ORAL_TABLET | Freq: Every day | ORAL | 6 refills | Status: DC

## 2017-10-19 NOTE — Progress Notes (Signed)
Subjective:   Patient ID: Joseph Livingston, male   DOB: 52 y.o.   MRN: [id number]   HPI Patient presents to discuss the results of his blood work also still has inflammation but in a different place than previous   ROS      Objective:  Physical Exam  Neurovascular status intact with patient's dorsal medial aspect first metatarsal head left doing well with pain in the plantar aspect of the left first MPJ with surgery of the big toe joint scheduled for November     Assessment:  Probable gout with inflammatory changes still noted plantar aspect first metatarsal     Plan:  Reviewed blood work indicated elevated uric acid and C-reactive protein sed rate.  Most likely gout will start him on allopurinol now 100 mg daily which I explained to him that I did do a plantar injection of the joint surface 3 mg Kenalog 5 mg Xylocaine

## 2017-10-25 ENCOUNTER — Other Ambulatory Visit (INDEPENDENT_AMBULATORY_CARE_PROVIDER_SITE_OTHER): Payer: 59

## 2017-10-25 DIAGNOSIS — R7989 Other specified abnormal findings of blood chemistry: Secondary | ICD-10-CM

## 2017-10-25 DIAGNOSIS — R945 Abnormal results of liver function studies: Secondary | ICD-10-CM

## 2017-10-25 LAB — HEPATIC FUNCTION PANEL
ALBUMIN: 4.8 g/dL (ref 3.5–5.2)
ALT: 70 U/L — ABNORMAL HIGH (ref 0–53)
AST: 41 U/L — ABNORMAL HIGH (ref 0–37)
Alkaline Phosphatase: 55 U/L (ref 39–117)
BILIRUBIN TOTAL: 1.2 mg/dL (ref 0.2–1.2)
Bilirubin, Direct: 0.2 mg/dL (ref 0.0–0.3)
Total Protein: 8 g/dL (ref 6.0–8.3)

## 2017-10-25 LAB — IBC PANEL
Iron: 79 ug/dL (ref 42–165)
SATURATION RATIOS: 18.1 % — AB (ref 20.0–50.0)
Transferrin: 311 mg/dL (ref 212.0–360.0)

## 2017-10-25 NOTE — Addendum Note (Signed)
Addended by: Trenda Moots on: 10/30/35 12:03 PM   Modules accepted: Orders

## 2017-10-26 LAB — IRON AND TIBC
IRON SATURATION: 20 % (ref 15–55)
IRON: 74 ug/dL (ref 38–169)
Total Iron Binding Capacity: 366 ug/dL (ref 250–450)
UIBC: 292 ug/dL (ref 111–343)

## 2017-10-27 ENCOUNTER — Encounter: Payer: Self-pay | Admitting: Gastroenterology

## 2017-10-27 ENCOUNTER — Ambulatory Visit (INDEPENDENT_AMBULATORY_CARE_PROVIDER_SITE_OTHER): Payer: 59 | Admitting: Gastroenterology

## 2017-10-27 ENCOUNTER — Other Ambulatory Visit: Payer: 59

## 2017-10-27 VITALS — BP 126/92 | HR 57 | Ht 67.0 in | Wt 188.1 lb

## 2017-10-27 DIAGNOSIS — Z1211 Encounter for screening for malignant neoplasm of colon: Secondary | ICD-10-CM

## 2017-10-27 DIAGNOSIS — K760 Fatty (change of) liver, not elsewhere classified: Secondary | ICD-10-CM

## 2017-10-27 MED ORDER — SUPREP BOWEL PREP KIT 17.5-3.13-1.6 GM/177ML PO SOLN: ORAL | 0 refills | Status: DC

## 2017-10-27 NOTE — Progress Notes (Signed)
HPI :  52 y/o male here for follow-up visit. Previously seen by Tye Savoy on 07/05/2017 for elevated liver enzymes. He's had an ALT elevation ranging from 50s to 90 since 2017. He's had serologic workup which has been negative for hepatitis B and C, negative for hemachromatosis, ceruloplasmin normal, alpha-1 antitrypsin normal, workup for autoimmune liver disease is negative. He had an ultrasound of his liver in December 2017 which showed fatty liver.  LFTs were repeated on August 21 showing ALT of 70 and AST of 41, this was improved from 3 months ago where ALT level was 94 and AST 56. He denies any family history of liver disease or cirrhosis. He reports his weight has been stable in the 180s for several years. He initially thought he had this reaction to Crestor which cause myalgias, however that was stopped and liver enzyme elevation has persisted. She is currently on pravastatin 20 mg and tolerated well. He consumes alcohol on most days of the week. He typically has one to 2 drinks at night of either wine or mixed drink. He sometimes drinks more heavily on the weekends.  Otherwise she is never had a prior colonoscopy. He denies any blood in the stools. No changes in his bowel habits. No abdominal pains. No family history of colon cancer. He reports he has a Cologuard kit at home but has not submitted it yet. We discussed colon cancer screening options.  [place] 02/18/2016 - fatty liver   Past Medical History:  Diagnosis Date  . Fatty liver   . Gout   . Hyperlipidemia   . Hypertension      Past Surgical History:  Procedure Laterality Date  . KNEE SURGERY Right 1983   Family History  Problem Relation Age of Onset  . Hypertension Mother   . Graves' disease Mother   . Hyperlipidemia Mother   . Heart failure Father   . Hypertension Father   . Prostate cancer Maternal Uncle    Social History   Tobacco Use  . Smoking status: Never Smoker  . Smokeless tobacco: Never Used  Substance  Use Topics  . Alcohol use: Yes  . Drug use: No   Current Outpatient Medications  Medication Sig Dispense Refill  . allopurinol (ZYLOPRIM) 100 MG tablet Take 1 tablet (100 mg total) by mouth daily. 30 tablet 6  . AMBULATORY NON FORMULARY MEDICATION Medication Name: whey powder    . AMBULATORY NON FORMULARY MEDICATION Medication Name: tumeric    . aspirin 81 MG tablet Take 81 mg by mouth daily.    . Cholecalciferol (VITAMIN D3) 2000 units capsule Take 1 capsule (2,000 Units total) by mouth daily. 100 capsule 3  . ezetimibe (ZETIA) 10 MG tablet TAKE 1 TABLET (10 MG TOTAL) BY MOUTH DAILY. 30 tablet 11  . MELATONIN PO Take by mouth at bedtime.    . Na Sulfate-K Sulfate-Mg Sulf 17.5-3.13-1.6 GM/177ML SOLN Suprep-Use as directed 354 mL 0  . omega-3 acid ethyl esters (LOVAZA) 1 g capsule Take 2 g by mouth 2 (two) times daily.    . pravastatin (PRAVACHOL) 20 MG tablet TAKE 1 TABLET BY MOUTH ONCE DAILY 30 tablet 8  . TEKTURNA HCT 300-12.5 MG TABS TAKE 1 TABLET BY MOUTH EVERY DAY IN THE MORNING 30 each 5  . VIAGRA 100 MG tablet Take 1 tablet (100 mg total) by mouth as needed. 10 tablet 11   No current facility-administered medications for this visit.    Allergies  Allergen Reactions  . Lisinopril Swelling  Swollen lips  . Crestor [Rosuvastatin Calcium] Rash     Review of Systems: All systems reviewed and negative except where noted in HPI.    Lab Results  Component Value Date   WBC 5.8 12/09/2016   HGB 15.1 12/09/2016   HCT 44.0 12/09/2016   MCV 91.4 12/09/2016   PLT 252.0 12/09/2016    Lab Results  Component Value Date   ALT 70 (H) 10/25/2017   AST 41 (H) 10/25/2017   ALKPHOS 55 10/25/2017   BILITOT 1.2 10/25/2017    Physical Exam: BP (!) 126/92   Pulse (!) 57   Ht '5\' 7"'  (1.702 m)   Wt 188 lb 2 oz (85.3 kg)   BMI 29.46 kg/m  Constitutional: Pleasant,well-developed, male in no acute distress. HEENT: Normocephalic and atraumatic. Conjunctivae are normal. No scleral  icterus. Neck supple.  Cardiovascular: Normal rate, regular rhythm.  Pulmonary/chest: Effort normal and breath sounds normal. No wheezing, rales or rhonchi. Abdominal: Soft, nondistended, nontender. There are no masses palpable. No hepatomegaly. Extremities: no edema Lymphadenopathy: No cervical adenopathy noted. Neurological: Alert and oriented to person place and time. Skin: Skin is warm and dry. No rashes noted. Psychiatric: Normal mood and affect. Behavior is normal.   ASSESSMENT AND PLAN: 52 year old male here for reassessment following issues:  Fatty liver - chronic mild ALT elevation which is very likely due to fatty liver noted on ultrasound. I think it less likely his statin use is causing this chronic elevation, and okay to continue it as long as ALT level remains stable. His labs are not consistent with alcoholic liver disease however he does consume alcohol routinely. We discussed the spectrum of fatty liver disease to include risks for cirrhosis. With his ongoing ALT elevation I'm recommending he minimize alcohol use and abstain if possible. We discussed this for a bit and he is willing to do this. He otherwise exercises routinely and his BMI has been stable, he should continue this. I will send testing for hepatitis A immunity, if he is not immune then recommend hepatitis A vaccine. He otherwise was not immune to hepatitis B based on labs in 2017 but he is not sure if he ever received this vaccine at work, he will check on this and will vaccinate for that as well if needed. Otherwise plan on repeating LFTs in 6 months and I will see him once yearly for this issue. All questions answered he verbalized understanding.  Colon cancer screening - discussed options for colon cancer screening. He is average risk for colon cancer, and is overdue for screening. We discussed the differences between stool based testing versus optical colonoscopy. He is otherwise in good shape and a low risk for  anesthesia, I'm recommending colonoscopy as a more proactive way to protect against colon cancer. Following this discussion he wanted to proceed. Further recommendations pending the results.  Elim Cellar, MD [facility]

## 2017-10-27 NOTE — Patient Instructions (Signed)
If you are age 51 or older, your body mass index should be between 23-30. Your Body mass index is 29.46 kg/m. If this is out of the aforementioned range listed, please consider follow up with your Primary Care Provider.  If you are age 38 or younger, your body mass index should be between 19-25. Your Body mass index is 29.46 kg/m. If this is out of the aformentioned range listed, please consider follow up with your Primary Care Provider.   You have been scheduled for a colonoscopy. Please follow written instructions given to you at your visit today.  Please pick up your prep supplies at the pharmacy within the next 1-3 days. If you use inhalers (even only as needed), please bring them with you on the day of your procedure. Your physician has requested that you go to www.startemmi.com and enter the access code given to you at your visit today. This web site gives a general overview about your procedure. However, you should still follow specific instructions given to you by our office regarding your preparation for the procedure.  Please go to the lab in the basement of our building to have lab work done as you leave today.  You will be due for LFT lab work in 6 months.  Please abstain from regular alcohol consumption.  Thank you for entrusting me with your care and for choosing [facility], Dr. [place] Cellar

## 2017-10-28 LAB — HEPATITIS A ANTIBODY, TOTAL: Hepatitis A AB,Total: NONREACTIVE

## 2017-10-30 ENCOUNTER — Telehealth: Payer: Self-pay

## 2017-10-30 ENCOUNTER — Other Ambulatory Visit: Payer: Self-pay

## 2017-10-30 NOTE — Progress Notes (Signed)
Per result note pt needs vaccination for Hep A and Hep B (pt verified he has not rec'd the Hep B vaccine)

## 2017-10-30 NOTE — Telephone Encounter (Signed)
-----   Message from Yetta Flock, MD sent at 10/30/2017 12:52 PM EDT ----- Mary Sella can you let this patient know he is NOT immune to hep A and needs a vaccination to this if you can help coordinate. He previously was not immune to hep B when tested a few years ago. He was checking to see if he ever got the hep B vaccine. If he has not had the hep B vaccine, he will need both. If he has had hep B vaccine, will only need hep A. Thanks

## 2017-10-30 NOTE — Telephone Encounter (Signed)
Jan, I was sent a phone call for this patient and he was returning your call. He relayed that he did NOT have the hep B immunization. Sorry I did not see where you were waiting to schedule him for injection. Will you at your convenience please contact him to schedule. Thanks.

## 2017-10-30 NOTE — Telephone Encounter (Signed)
Called and spoke to pt.  Scheduled pt for 1st and 2nd Twinrix inj (11-08-17 and 12-11-17). Pt works 8am to 8pm  Monday thru Friday so he needs a time closest to the lunch hour.

## 2017-11-07 ENCOUNTER — Telehealth: Payer: Self-pay | Admitting: Internal Medicine

## 2017-11-07 NOTE — Telephone Encounter (Signed)
Copied from [place] [id number]. Topic: Quick Communication - See Telephone Encounter >> Nov 07, 2017 10:17 AM Ahmed Prima L wrote: CRM for notification. See Telephone encounter for: 11/07/17.  Patient would like Dr Alain Marion to take a look at his medication allopurinol (ZYLOPRIM) 100 MG tablet for his gout. He said that Dr Paulla Dolly prescribed this. He wants to make sure this is the correct dosage he should be taking and how does he take it? He just wants some clarification on this. He would like the nurse to call him back on exactly what he needs to do.

## 2017-11-08 ENCOUNTER — Ambulatory Visit (INDEPENDENT_AMBULATORY_CARE_PROVIDER_SITE_OTHER): Payer: 59 | Admitting: Gastroenterology

## 2017-11-08 ENCOUNTER — Other Ambulatory Visit: Payer: Self-pay | Admitting: Internal Medicine

## 2017-11-08 DIAGNOSIS — Z23 Encounter for immunization: Secondary | ICD-10-CM

## 2017-11-08 DIAGNOSIS — R972 Elevated prostate specific antigen [PSA]: Secondary | ICD-10-CM | POA: Diagnosis not present

## 2017-11-08 NOTE — Telephone Encounter (Signed)
Ok to ref x2 mo Sch OV Thx

## 2017-11-08 NOTE — Telephone Encounter (Signed)
Patient was in the building and stopped by to check on this. Gave MD response. He will check his schedule and call back to make an appointment with Dr Alain Marion for his physical.

## 2017-12-01 ENCOUNTER — Ambulatory Visit: Payer: 59 | Admitting: Nurse Practitioner

## 2017-12-04 ENCOUNTER — Other Ambulatory Visit: Payer: Self-pay | Admitting: Internal Medicine

## 2017-12-07 ENCOUNTER — Other Ambulatory Visit: Payer: Self-pay | Admitting: Internal Medicine

## 2017-12-11 ENCOUNTER — Ambulatory Visit (INDEPENDENT_AMBULATORY_CARE_PROVIDER_SITE_OTHER): Payer: 59 | Admitting: Gastroenterology

## 2017-12-11 DIAGNOSIS — Z23 Encounter for immunization: Secondary | ICD-10-CM

## 2017-12-21 ENCOUNTER — Encounter: Payer: 59 | Admitting: Gastroenterology

## 2017-12-29 ENCOUNTER — Ambulatory Visit: Payer: 59 | Admitting: Podiatry

## 2018-01-04 ENCOUNTER — Other Ambulatory Visit: Payer: Self-pay | Admitting: Internal Medicine

## 2018-01-05 ENCOUNTER — Encounter: Payer: Self-pay | Admitting: Podiatry

## 2018-01-05 ENCOUNTER — Ambulatory Visit: Payer: 59 | Admitting: Podiatry

## 2018-01-05 DIAGNOSIS — M2011 Hallux valgus (acquired), right foot: Secondary | ICD-10-CM

## 2018-01-05 DIAGNOSIS — M2012 Hallux valgus (acquired), left foot: Secondary | ICD-10-CM

## 2018-01-05 DIAGNOSIS — M109 Gout, unspecified: Secondary | ICD-10-CM

## 2018-01-10 NOTE — Progress Notes (Signed)
Subjective:   Patient ID: Joseph Livingston, male   DOB: 52 y.o.   MRN: [id number]   HPI Patient states the medicine worked really well and I would like to avoid surgery at this point if I can   ROS      Objective:  Physical Exam  Neurovascular status intact with patient found to have good digital flow with patient's left foot still showing some swelling but no indications currently of the fluid buildup that has been present previously     Assessment:  Is doing very well with medication and currently is not having symptoms around his big toe joint even though they may recur at any time     Plan:  At this point were to just take this as a watch and were not can do surgery and we will use wider shoes soaks and continue medication and reappoint as symptoms indicate

## 2018-01-16 ENCOUNTER — Ambulatory Visit: Payer: 59 | Admitting: Internal Medicine

## 2018-01-16 ENCOUNTER — Encounter

## 2018-01-25 ENCOUNTER — Other Ambulatory Visit: Payer: Self-pay | Admitting: Internal Medicine

## 2018-02-06 ENCOUNTER — Other Ambulatory Visit: Payer: Self-pay | Admitting: Internal Medicine

## 2018-02-15 DIAGNOSIS — R972 Elevated prostate specific antigen [PSA]: Secondary | ICD-10-CM | POA: Diagnosis not present

## 2018-02-26 ENCOUNTER — Encounter: Payer: Self-pay | Admitting: Internal Medicine

## 2018-02-26 ENCOUNTER — Other Ambulatory Visit (INDEPENDENT_AMBULATORY_CARE_PROVIDER_SITE_OTHER): Payer: 59

## 2018-02-26 ENCOUNTER — Other Ambulatory Visit: Payer: Self-pay | Admitting: Internal Medicine

## 2018-02-26 ENCOUNTER — Ambulatory Visit: Payer: 59 | Admitting: Internal Medicine

## 2018-02-26 VITALS — BP 120/72 | HR 116 | Temp 98.3°F | Ht 67.0 in | Wt 194.0 lb

## 2018-02-26 DIAGNOSIS — Z0000 Encounter for general adult medical examination without abnormal findings: Secondary | ICD-10-CM

## 2018-02-26 DIAGNOSIS — R972 Elevated prostate specific antigen [PSA]: Secondary | ICD-10-CM | POA: Diagnosis not present

## 2018-02-26 DIAGNOSIS — I10 Essential (primary) hypertension: Secondary | ICD-10-CM

## 2018-02-26 DIAGNOSIS — M109 Gout, unspecified: Secondary | ICD-10-CM | POA: Diagnosis not present

## 2018-02-26 DIAGNOSIS — R945 Abnormal results of liver function studies: Secondary | ICD-10-CM

## 2018-02-26 DIAGNOSIS — R7989 Other specified abnormal findings of blood chemistry: Secondary | ICD-10-CM

## 2018-02-26 LAB — CBC WITH DIFFERENTIAL/PLATELET
BASOS ABS: 0.1 10*3/uL (ref 0.0–0.1)
Basophils Relative: 1 % (ref 0.0–3.0)
EOS ABS: 0.1 10*3/uL (ref 0.0–0.7)
Eosinophils Relative: 2.4 % (ref 0.0–5.0)
HCT: 44.2 % (ref 39.0–52.0)
Hemoglobin: 15.1 g/dL (ref 13.0–17.0)
Lymphocytes Relative: 26.9 % (ref 12.0–46.0)
Lymphs Abs: 1.4 10*3/uL (ref 0.7–4.0)
MCHC: 34.2 g/dL (ref 30.0–36.0)
MCV: 90.7 fl (ref 78.0–100.0)
Monocytes Absolute: 0.4 10*3/uL (ref 0.1–1.0)
Monocytes Relative: 8.2 % (ref 3.0–12.0)
Neutro Abs: 3.2 10*3/uL (ref 1.4–7.7)
Neutrophils Relative %: 61.5 % (ref 43.0–77.0)
Platelets: 257 10*3/uL (ref 150.0–400.0)
RBC: 4.87 Mil/uL (ref 4.22–5.81)
RDW: 13.4 % (ref 11.5–15.5)
WBC: 5.2 10*3/uL (ref 4.0–10.5)

## 2018-02-26 LAB — BASIC METABOLIC PANEL
BUN: 14 mg/dL (ref 6–23)
CALCIUM: 10.5 mg/dL (ref 8.4–10.5)
CO2: 27 mEq/L (ref 19–32)
CREATININE: 1.27 mg/dL (ref 0.40–1.50)
Chloride: 95 mEq/L — ABNORMAL LOW (ref 96–112)
GFR: 76.67 mL/min (ref >60.00)
Glucose, Bld: 91 mg/dL (ref 70–99)
Potassium: 4.1 mEq/L (ref 3.5–5.1)
Sodium: 135 mEq/L (ref 135–145)

## 2018-02-26 LAB — URINALYSIS, ROUTINE W REFLEX MICROSCOPIC
Bilirubin Urine: NEGATIVE
Hgb urine dipstick: NEGATIVE
Ketones, ur: NEGATIVE
Leukocytes, UA: NEGATIVE
Nitrite: NEGATIVE
RBC / HPF: NONE SEEN (ref 0–?)
Specific Gravity, Urine: 1.015 (ref 1.000–1.030)
Total Protein, Urine: 100 — AB
URINE GLUCOSE: NEGATIVE
Urobilinogen, UA: 0.2 (ref 0.0–1.0)
pH: 7 (ref 5.0–8.0)

## 2018-02-26 LAB — URIC ACID: Uric Acid, Serum: 7.9 mg/dL — ABNORMAL HIGH (ref 4.0–7.8)

## 2018-02-26 LAB — LIPID PANEL
Cholesterol: 311 mg/dL — ABNORMAL HIGH (ref 0–200)
HDL: 57.6 mg/dL (ref >39.00)
Total CHOL/HDL Ratio: 5
Triglycerides: 457 mg/dL — ABNORMAL HIGH (ref 0.0–149.0)

## 2018-02-26 LAB — HEPATIC FUNCTION PANEL
ALBUMIN: 5.1 g/dL (ref 3.5–5.2)
ALT: 79 U/L — ABNORMAL HIGH (ref 0–53)
AST: 57 U/L — ABNORMAL HIGH (ref 0–37)
Alkaline Phosphatase: 53 U/L (ref 39–117)
Bilirubin, Direct: 0.1 mg/dL (ref 0.0–0.3)
Total Bilirubin: 0.9 mg/dL (ref 0.2–1.2)
Total Protein: 8.6 g/dL — ABNORMAL HIGH (ref 6.0–8.3)

## 2018-02-26 LAB — TSH: TSH: 1.67 u[IU]/mL (ref 0.35–4.50)

## 2018-02-26 LAB — LDL CHOLESTEROL, DIRECT: LDL DIRECT: 168 mg/dL

## 2018-02-26 NOTE — Progress Notes (Signed)
Subjective:  Patient ID: Joseph Livingston, male    DOB: [birth year]  Age: 52 y.o. MRN: [id number]  CC: No chief complaint on file.   HPI Joseph Livingston presents for HTN, elevated PSA - Dr Karsten Ro, dyslipidemia f/u Gout per Dr Paulla Dolly  Outpatient Medications Prior to Visit  Medication Sig Dispense Refill  . [place]-hydroCHLOROthiazide (TEKTURNA HCT) 300-12.5 MG TABS TAKE 1 BY MOUTH EVERY DAY IN THE MORNING . Needs appointment. 15 each 0  . allopurinol (ZYLOPRIM) 100 MG tablet Take 1 tablet (100 mg total) by mouth daily. 30 tablet 6  . AMBULATORY NON FORMULARY MEDICATION Medication Name: whey powder    . AMBULATORY NON FORMULARY MEDICATION Medication Name: tumeric    . aspirin 81 MG tablet Take 81 mg by mouth daily.    . Cholecalciferol (VITAMIN D3) 2000 units capsule Take 1 capsule (2,000 Units total) by mouth daily. 100 capsule 3  . ezetimibe (ZETIA) 10 MG tablet TAKE 1 TABLET (10 MG TOTAL) BY MOUTH DAILY. 15 tablet 0  . MELATONIN PO Take by mouth at bedtime.    . Na Sulfate-K Sulfate-Mg Sulf 17.5-3.13-1.6 GM/177ML SOLN Suprep-Use as directed 354 mL 0  . omega-3 acid ethyl esters (LOVAZA) 1 g capsule Take 2 g by mouth 2 (two) times daily.    . pravastatin (PRAVACHOL) 20 MG tablet TAKE 1 TABLET (20 MG TOTAL) BY MOUTH DAILY. PATIENT NEEDS OFFICE VISIT BEFORE REFILLS WILL BE GIVEN 30 tablet 3  . SUPREP BOWEL PREP KIT 17.5-3.13-1.6 GM/177ML SOLN Suprep-Use as directed 354 mL 0  . VIAGRA 100 MG tablet TAKE 1 TABLET (100 MG TOTAL) BY MOUTH AS NEEDED. 10 tablet 11   No facility-administered medications prior to visit.     ROS: Review of Systems  Constitutional: Negative for appetite change, fatigue and unexpected weight change.  HENT: Negative for congestion, nosebleeds, sneezing, sore throat and trouble swallowing.   Eyes: Negative for itching and visual disturbance.  Respiratory: Negative for cough.   Cardiovascular: Negative for chest pain, palpitations and leg swelling.    Gastrointestinal: Negative for abdominal distention, blood in stool, diarrhea and nausea.  Genitourinary: Negative for frequency and hematuria.  Musculoskeletal: Negative for back pain, gait problem, joint swelling and neck pain.  Skin: Negative for rash.  Neurological: Negative for dizziness, tremors, speech difficulty and weakness.  Psychiatric/Behavioral: Negative for agitation, dysphoric mood, sleep disturbance and suicidal ideas. The patient is not nervous/anxious.     Objective:  BP 120/72 (BP Location: Left Arm, Patient Position: Sitting, Cuff Size: Large)   Pulse (!) 116   Temp 98.3 F (36.8 C) (Oral)   Ht '5\' 7"'  (1.702 m)   Wt 194 lb (88 kg)   SpO2 97%   BMI 30.38 kg/m   BP Readings from Last 3 Encounters:  02/26/18 120/72  10/27/17 (!) 126/92  07/05/17 (!) 142/86    Wt Readings from Last 3 Encounters:  02/26/18 194 lb (88 kg)  10/27/17 188 lb 2 oz (85.3 kg)  07/05/17 194 lb 6.4 oz (88.2 kg)    Physical Exam Constitutional:      General: He is not in acute distress.    Appearance: He is well-developed.     Comments: NAD  Eyes:     Conjunctiva/sclera: Conjunctivae normal.     Pupils: Pupils are equal, round, and reactive to light.  Neck:     Musculoskeletal: Normal range of motion.     Thyroid: No thyromegaly.     Vascular: No JVD.  Cardiovascular:  Rate and Rhythm: Normal rate and regular rhythm.     Heart sounds: Normal heart sounds. No murmur. No friction rub. No gallop.   Pulmonary:     Effort: Pulmonary effort is normal. No respiratory distress.     Breath sounds: Normal breath sounds. No wheezing or rales.  Chest:     Chest wall: No tenderness.  Abdominal:     General: Bowel sounds are normal. There is no distension.     Palpations: Abdomen is soft. There is no mass.     Tenderness: There is no abdominal tenderness. There is no guarding or rebound.  Musculoskeletal: Normal range of motion.        General: No tenderness.  Lymphadenopathy:      Cervical: No cervical adenopathy.  Skin:    General: Skin is warm and dry.     Findings: No rash.  Neurological:     Mental Status: He is alert and oriented to person, place, and time.     Cranial Nerves: No cranial nerve deficit.     Motor: No abnormal muscle tone.     Coordination: Coordination normal.     Gait: Gait normal.     Deep Tendon Reflexes: Reflexes are normal and symmetric.  Psychiatric:        Behavior: Behavior normal.        Thought Content: Thought content normal.        Judgment: Judgment normal.    Rectal - per Dr Karsten Ro Lab Results  Component Value Date   WBC 5.8 12/09/2016   HGB 15.1 12/09/2016   HCT 44.0 12/09/2016   PLT 252.0 12/09/2016   GLUCOSE 99 12/09/2016   CHOL 280 (H) 12/09/2016   TRIG 211.0 (H) 12/09/2016   HDL 61.00 12/09/2016   LDLDIRECT 181.0 12/09/2016   ALT 70 (H) 10/25/2017   AST 41 (H) 10/25/2017   NA 130 (L) 12/09/2016   K 4.0 12/09/2016   CL 91 (L) 12/09/2016   CREATININE 1.27 12/09/2016   BUN 17 12/09/2016   CO2 28 12/09/2016   TSH 0.95 12/09/2016   PSA 9.61 (H) 11/25/2015    Mr Prostate W Wo Contrast  Result Date: 07/28/2017 CLINICAL DATA:  Elevated PSA. 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 EXAM: MR PROSTATE WITHOUT AND WITH CONTRAST TECHNIQUE: Multiplanar multisequence MRI images were obtained of the pelvis centered about the prostate. Pre and post contrast images were obtained. CONTRAST:  74m MULTIHANCE GADOBENATE DIMEGLUMINE 529 MG/ML IV SOLN COMPARISON:  None. FINDINGS: Prostate: There is homogeneous relatively low signal within the peripheral zone (typically uniform high signal) ; however, there are no foci of restricted diffusion within the peripheral zone (series 11). No foci of abnormal enhancement the peripheral zone. The transitional zone is small and nodular without abnormal T2 signal. The prostatic capsule is intact.  Seminal vesicles are normal. Volume: 4.2 by 3.0 by 4.6 cm (volume = 30 cm^3)  Transcapsular spread:  Absent Seminal vesicle involvement: Absent Neurovascular bundle involvement: Absent Pelvic adenopathy: Absent Bone metastasis: Absent Other findings: None IMPRESSION: 1. No evidence of high-grade carcinoma peripheral zone (PI-RADS 1). 2. Small nodular transitional zone. Electronically Signed   By: SSuzy BouchardM.D.   On: 07/28/2017 11:08    Assessment & Plan:   There are no diagnoses linked to this encounter.   No orders of the defined types were placed in this encounter.    Follow-up: No follow-ups on file.  AWalker Kehr MD

## 2018-02-26 NOTE — Assessment & Plan Note (Signed)
Dr Karsten Ro. 2019 MRI - nl, bx (-)

## 2018-02-26 NOTE — Assessment & Plan Note (Signed)
Labs

## 2018-02-26 NOTE — Assessment & Plan Note (Addendum)
We discussed age appropriate health related issues, including available/recomended screening tests and vaccinations. We discussed a need for adhering to healthy diet and exercise. Labs were ordered to be later reviewed . All questions were answered. cardiac CT scan for calcium scoring offered cologuard - he has a kit at home Declined shots

## 2018-02-26 NOTE — Patient Instructions (Signed)

## 2018-02-26 NOTE — Assessment & Plan Note (Signed)
On Rx - resolved

## 2018-02-26 NOTE — Assessment & Plan Note (Signed)
cardiac CT scan for calcium scoring offered 

## 2018-03-01 ENCOUNTER — Other Ambulatory Visit: Payer: Self-pay | Admitting: Internal Medicine

## 2018-03-01 NOTE — Telephone Encounter (Signed)
Pt states he is out of this medication. Pt checking status.

## 2018-03-01 NOTE — Telephone Encounter (Signed)
Pt calling to check on this.  States he was in the office on 12/23 and thought refills were to be called in at that time.

## 2018-04-19 ENCOUNTER — Encounter: Payer: Self-pay | Admitting: Podiatry

## 2018-04-19 ENCOUNTER — Ambulatory Visit: Payer: 59 | Admitting: Podiatry

## 2018-04-19 ENCOUNTER — Ambulatory Visit (INDEPENDENT_AMBULATORY_CARE_PROVIDER_SITE_OTHER): Payer: 59

## 2018-04-19 DIAGNOSIS — M779 Enthesopathy, unspecified: Secondary | ICD-10-CM

## 2018-04-19 DIAGNOSIS — M7662 Achilles tendinitis, left leg: Secondary | ICD-10-CM | POA: Diagnosis not present

## 2018-04-19 DIAGNOSIS — M2012 Hallux valgus (acquired), left foot: Secondary | ICD-10-CM

## 2018-04-19 DIAGNOSIS — M2011 Hallux valgus (acquired), right foot: Secondary | ICD-10-CM

## 2018-04-19 MED ORDER — DICLOFENAC SODIUM 75 MG PO TBEC: 75.0000 mg | DELAYED_RELEASE_TABLET | Freq: Two times a day (BID) | ORAL | 2 refills | Status: DC

## 2018-04-19 NOTE — Patient Instructions (Signed)

## 2018-04-19 NOTE — Progress Notes (Signed)
Subjective:   Patient ID: Joseph Livingston, male   DOB: 53 y.o.   MRN: [id number]   HPI Patient presents stating the pain in my left ankle started after I did an intense workout and I just 1 to make sure I did do anything bad   ROS      Objective:  Physical Exam  Neurovascular status intact with patient found to have mild discomfort posterior aspect left heel that is gotten better over the last couple weeks with no indications of strength loss or tear of the tendon itself.  Patient is found to have good digital perfusion well oriented x3     Assessment:  Probability for strain of the left Achilles with mild fascial-like symptoms     Plan:  H&P x-ray reviewed condition discussed.  I recommended stretching exercises anti-inflammatories and placed on diclofenac 75 mg twice daily.  Gave instructions on ice therapy and shoe gear modifications and reappoint if symptoms persist  X-rays indicate small spur but no indication stress fracture or advanced arthritis

## 2018-04-20 ENCOUNTER — Other Ambulatory Visit: Payer: Self-pay | Admitting: Podiatry

## 2018-05-10 ENCOUNTER — Ambulatory Visit: Payer: 59 | Admitting: Podiatry

## 2018-05-11 ENCOUNTER — Telehealth: Payer: Self-pay

## 2018-05-11 DIAGNOSIS — I10 Essential (primary) hypertension: Secondary | ICD-10-CM

## 2018-05-11 NOTE — Telephone Encounter (Signed)
CT ordered, they will call pt to schedule  Copied from [place] [phone]. Topic: Referral - Request for Referral >> May 07, 2018 10:43 AM Scherrie Gerlach wrote: Reason for CRM: pt states on 02/22/18 after labs dr left message   "It would be helpful to obtain his CT calcium scoring of the heart. " pt wants to know how to go forward with this procedure, he would like to proceed.

## 2018-05-30 ENCOUNTER — Other Ambulatory Visit: Payer: Self-pay | Admitting: Podiatry

## 2018-06-11 ENCOUNTER — Telehealth: Payer: Self-pay | Admitting: Gastroenterology

## 2018-06-11 DIAGNOSIS — Z23 Encounter for immunization: Secondary | ICD-10-CM

## 2018-06-11 MED ORDER — HEPATITIS A-HEP B RECOMB VAC 720-20 ELU-MCG/ML IM SUSP: 1.0000 mL | Freq: Once | INTRAMUSCULAR | 0 refills | Status: AC

## 2018-06-11 NOTE — Telephone Encounter (Signed)
Called patient and let him know we are currently not doing immunizations. Gave patient option for me to mail him a script for his 3rd. Injection and he could take it to his Pharmacy. Patient agreed to this. Mailed script to patient.

## 2018-06-11 NOTE — Telephone Encounter (Signed)
Pt states that he is due for another Hep A& B injection and wants to know if he can be scheduled given the circumstances with Covid 19. Pls call him.

## 2018-06-12 ENCOUNTER — Other Ambulatory Visit: Payer: Self-pay | Admitting: Internal Medicine

## 2018-06-12 MED ORDER — VIAGRA 100 MG PO TABS: 100.0000 mg | ORAL_TABLET | ORAL | 11 refills | Status: DC | PRN

## 2018-06-21 ENCOUNTER — Telehealth: Payer: Self-pay | Admitting: Internal Medicine

## 2018-06-21 DIAGNOSIS — R972 Elevated prostate specific antigen [PSA]: Secondary | ICD-10-CM | POA: Diagnosis not present

## 2018-06-21 NOTE — Telephone Encounter (Signed)
Patient called to advise that his Viagra was declined by his insurance and he would like a prior authorization done. Contact patient at [phone] (mobile)

## 2018-06-25 NOTE — Telephone Encounter (Signed)
Patient called said that the pharmacy is also not doing ay injections. Patient would like to know what to do with his 3rd injection.

## 2018-06-25 NOTE — Telephone Encounter (Signed)
Spoke with patient and let him know we will do his 3rd. Hep A/B immunization when COVID-19 restrictions are lifted. We can then do a titer and he may not have to start over.

## 2018-06-28 NOTE — Telephone Encounter (Signed)
Key: [id number]

## 2018-06-29 ENCOUNTER — Telehealth: Payer: Self-pay

## 2018-06-29 NOTE — Telephone Encounter (Signed)
Pt stated he needs his third twinrix vaccine. Has received his first two from GI. He stated they are not giving him his third vaccine due to the virus and he has to wait. Pt would like to know if we can give him his third vaccine. Please advise.

## 2018-06-29 NOTE — Telephone Encounter (Signed)
PA approved, pt notified

## 2018-07-02 NOTE — Telephone Encounter (Signed)
Ok to give 3d dose here Thx

## 2018-07-05 ENCOUNTER — Ambulatory Visit (INDEPENDENT_AMBULATORY_CARE_PROVIDER_SITE_OTHER): Payer: 59

## 2018-07-05 DIAGNOSIS — Z299 Encounter for prophylactic measures, unspecified: Secondary | ICD-10-CM

## 2018-07-05 DIAGNOSIS — Z23 Encounter for immunization: Secondary | ICD-10-CM | POA: Diagnosis not present

## 2018-08-15 ENCOUNTER — Other Ambulatory Visit: Payer: Self-pay

## 2018-08-15 ENCOUNTER — Telehealth: Payer: Self-pay

## 2018-08-15 DIAGNOSIS — K760 Fatty (change of) liver, not elsewhere classified: Secondary | ICD-10-CM

## 2018-08-15 DIAGNOSIS — R7989 Other specified abnormal findings of blood chemistry: Secondary | ICD-10-CM

## 2018-08-15 NOTE — Telephone Encounter (Signed)
Called and spoke to pt.  He will go to the lab for LFTs. Letter sent as well

## 2018-08-15 NOTE — Telephone Encounter (Signed)
-----   Message from Roetta Sessions, [place] sent at 04/25/2018  7:54 AM EST ----- Regarding: LFTs due in June LFTs due in June 2020

## 2018-09-03 ENCOUNTER — Telehealth: Payer: Self-pay | Admitting: *Deleted

## 2018-09-03 NOTE — Telephone Encounter (Signed)

## 2018-09-04 ENCOUNTER — Ambulatory Visit (INDEPENDENT_AMBULATORY_CARE_PROVIDER_SITE_OTHER)
Admission: RE | Admit: 2018-09-04 | Discharge: 2018-09-04 | Disposition: A | Discharge status: Home / Self Care | Payer: Self-pay | Source: Ambulatory Visit | Attending: Internal Medicine | Admitting: Internal Medicine

## 2018-09-04 ENCOUNTER — Other Ambulatory Visit: Payer: Self-pay

## 2018-09-04 DIAGNOSIS — I10 Essential (primary) hypertension: Secondary | ICD-10-CM

## 2018-09-05 ENCOUNTER — Encounter: Payer: Self-pay | Admitting: *Deleted

## 2018-12-05 IMAGING — US US ABDOMEN COMPLETE
1 series · 14 of 25 positions shown · non-contrast
Comparison: None.

CLINICAL DATA: Elevated liver function tests.

EXAM:
ABDOMEN ULTRASOUND COMPLETE

[Series 1: us abdomen complete · 0.27mm/px · 14 of 88 slices shown]
[im 1/88]
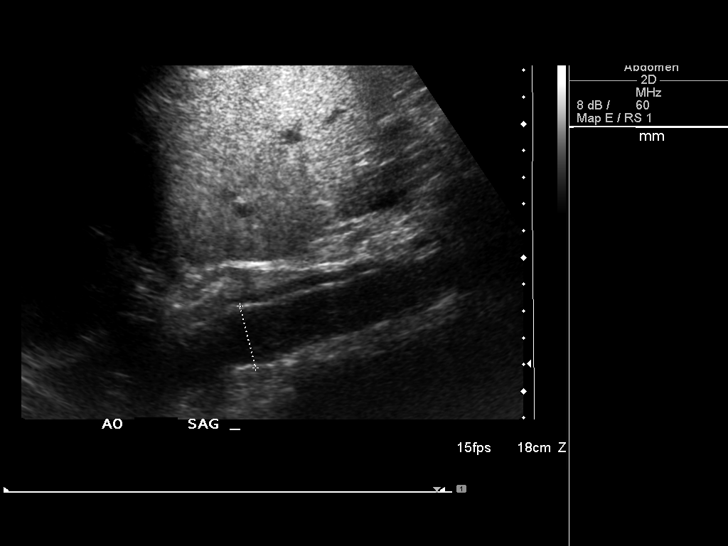
[im 8/88]
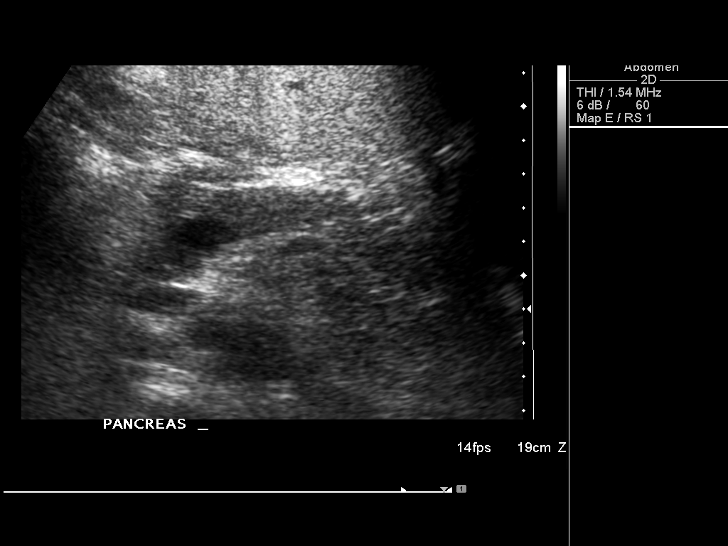
[im 15/88]
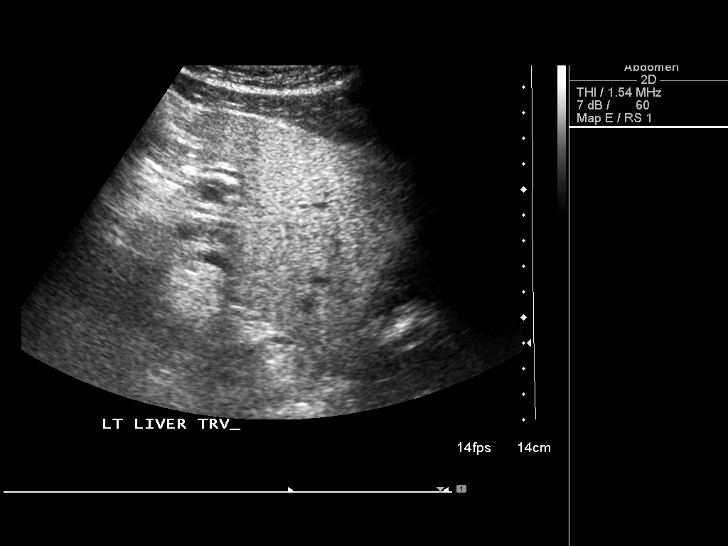
[im 22/88]
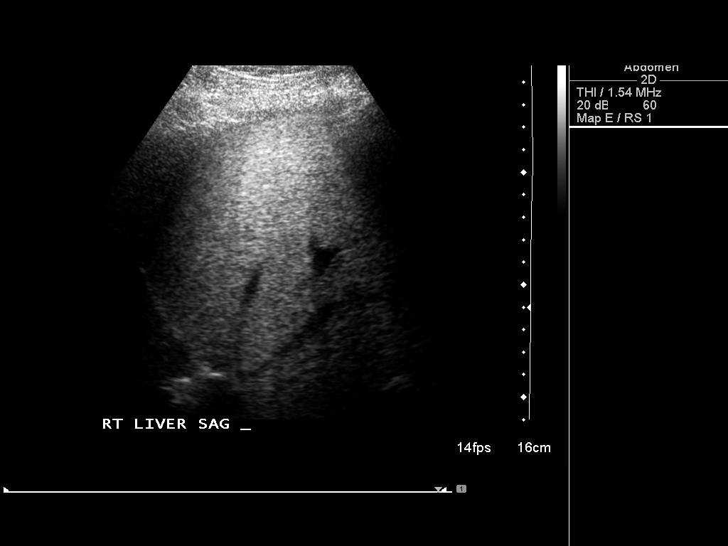
[im 30/88]
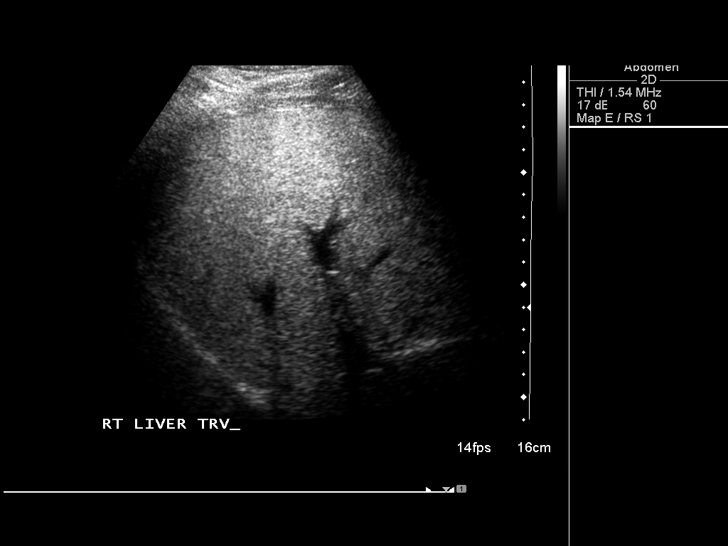
[im 33/88]
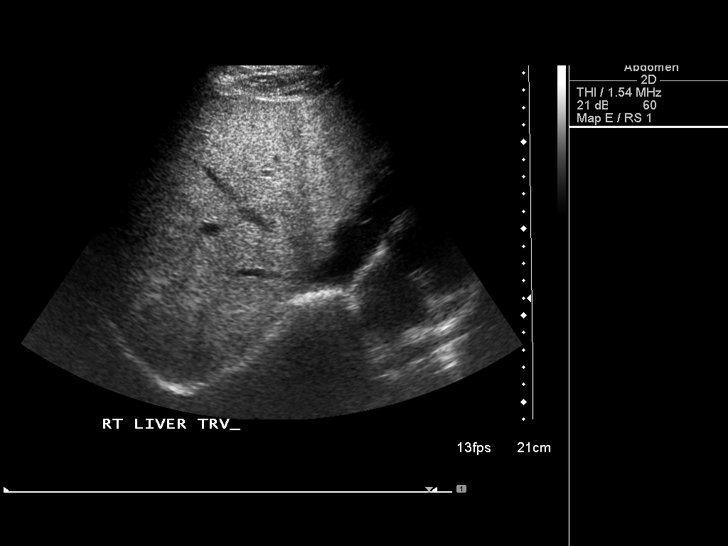
[im 40/88]
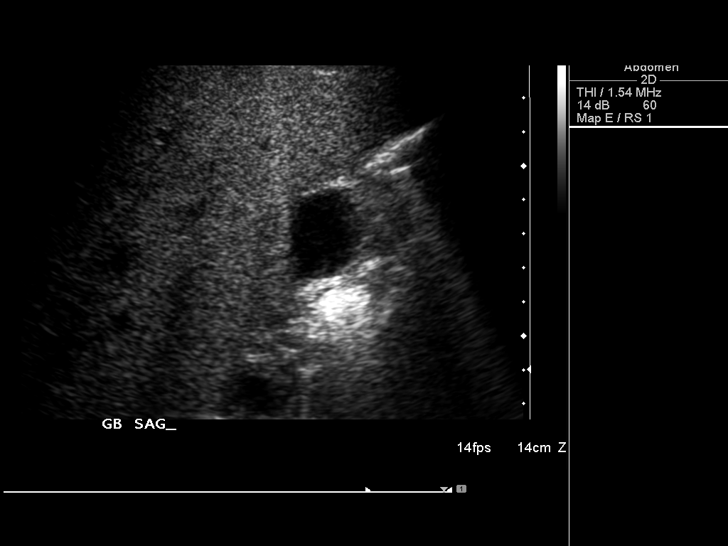
[im 48/88]
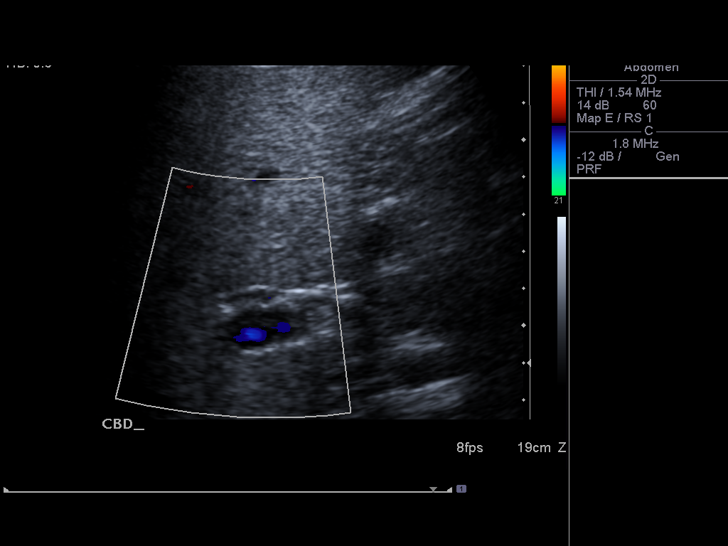
[im 55/88]
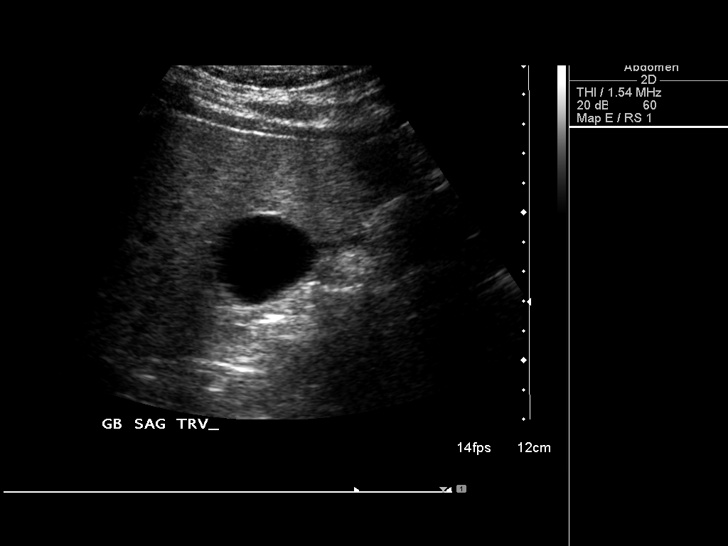
[im 59/88]
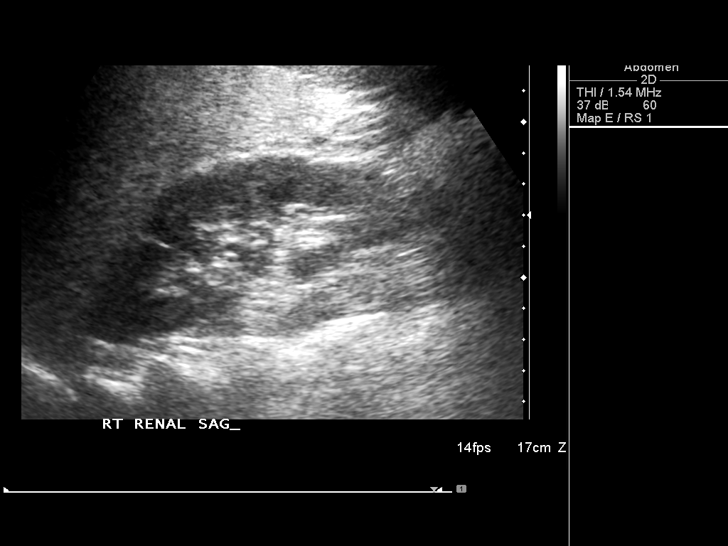
[im 66/88]
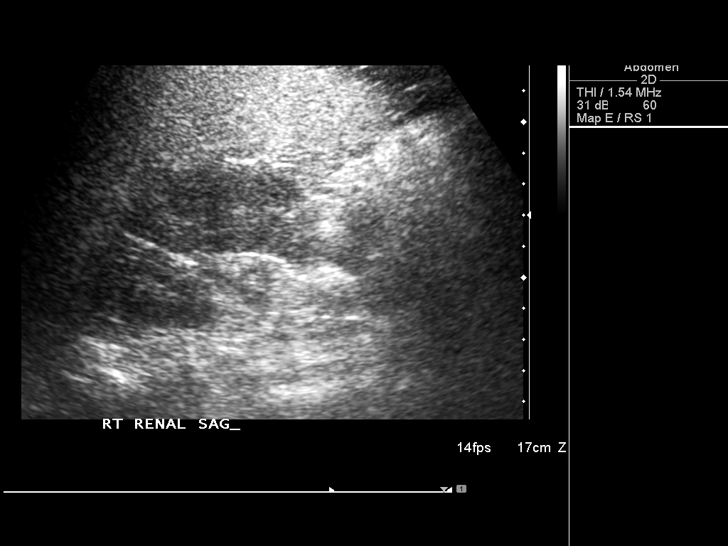
[im 73/88]
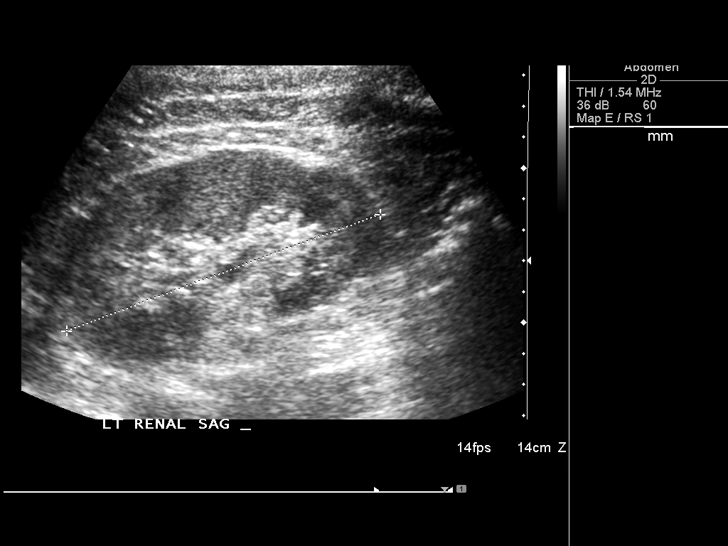
[im 80/88]
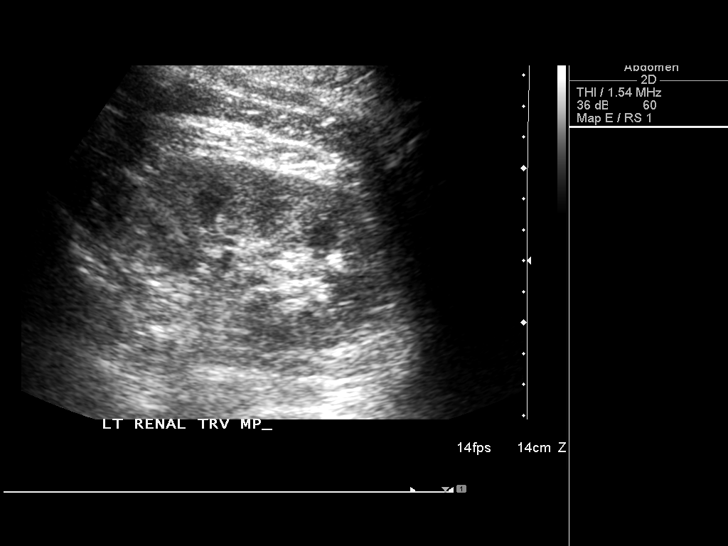
[im 88/88]
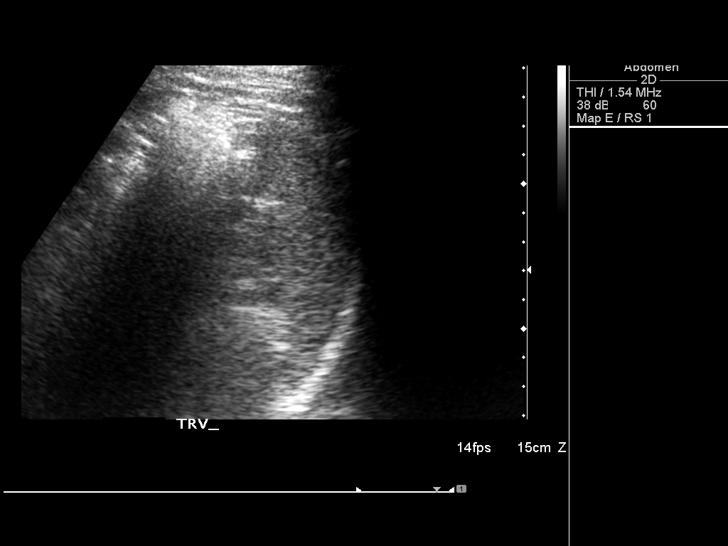

[14 of 25 positions shown; findings below may reference images not displayed]

FINDINGS: Gallbladder: No gallstones or wall thickening visualized. No
sonographic Murphy sign noted by sonographer.

Common bile duct: Diameter: 3.8 mm which is within normal limits.

Liver: No focal lesion identified. Increased echogenicity of hepatic
parenchyma is noted consistent with fatty infiltration.

IVC: No abnormality visualized.

Pancreas: Visualized portion unremarkable.

Spleen: Size and appearance within normal limits.

Right Kidney: Length: 12 cm. Echogenicity within normal limits. No
mass or hydronephrosis visualized.

Left Kidney: Length: 10.8 cm. Echogenicity within normal limits. No
mass or hydronephrosis visualized.

Abdominal aorta: No aneurysm visualized.

Other findings: None.
IMPRESSION: Increased echogenicity of hepatic parenchyma consistent with fatty
infiltration. No other abnormality seen in the abdomen.

## 2018-12-14 ENCOUNTER — Other Ambulatory Visit: Payer: Self-pay | Admitting: Urology

## 2018-12-14 DIAGNOSIS — R972 Elevated prostate specific antigen [PSA]: Secondary | ICD-10-CM

## 2018-12-24 ENCOUNTER — Other Ambulatory Visit: Payer: Self-pay | Admitting: Internal Medicine

## 2019-01-01 ENCOUNTER — Other Ambulatory Visit: Payer: Self-pay

## 2019-01-01 ENCOUNTER — Ambulatory Visit
Admission: RE | Admit: 2019-01-01 | Discharge: 2019-01-01 | Disposition: A | Discharge status: Home / Self Care | Payer: 59 | Source: Ambulatory Visit | Attending: Urology | Admitting: Urology

## 2019-01-01 DIAGNOSIS — R972 Elevated prostate specific antigen [PSA]: Secondary | ICD-10-CM

## 2019-01-01 MED ORDER — GADOBENATE DIMEGLUMINE 529 MG/ML IV SOLN
18.0000 mL | Freq: Once | INTRAVENOUS | Status: AC | PRN
  Administered 2019-01-01: 09:00:00 18 mL via INTRAVENOUS

## 2019-02-15 ENCOUNTER — Encounter: Payer: Self-pay | Admitting: Podiatry

## 2019-02-15 ENCOUNTER — Other Ambulatory Visit: Payer: Self-pay

## 2019-02-15 ENCOUNTER — Ambulatory Visit (INDEPENDENT_AMBULATORY_CARE_PROVIDER_SITE_OTHER): Payer: 59 | Admitting: Podiatry

## 2019-02-15 DIAGNOSIS — B351 Tinea unguium: Secondary | ICD-10-CM

## 2019-02-15 DIAGNOSIS — M7672 Peroneal tendinitis, left leg: Secondary | ICD-10-CM | POA: Diagnosis not present

## 2019-02-15 DIAGNOSIS — M7671 Peroneal tendinitis, right leg: Secondary | ICD-10-CM

## 2019-02-15 NOTE — Progress Notes (Signed)
Subjective:   Patient ID: Liz Beach, male   DOB: 53 y.o.   MRN: [id number]   HPI Patient states having a lot of pain in the outside of the left foot with new boots needs new orthotics due to cement floors and on the right foot states that there is thickness of the nailbed that he is concerned about   ROS      Objective:  Physical Exam  Neurovascular status intact with inflammation peroneal insertion base of fifth metatarsal left with thickness of the right hallux lateral side with mild discomfort and moderate depression of the arch with orthotics that have moderately flattened out     Assessment:  Inflammatory peroneal tendinitis left with ingrown mycotic nail infection right hallux with history of gout also noted and pain in his feet     Plan:  Sterile prep injected the outside of the left peroneal insertion 3 mg Dexasone Kenalog 5 mg Xylocaine advised on reduced activity to start now discussed what to do if any gout attacks were to occur in the future discussed nail condition and do not recommend current treatment with consideration for nail border removal and went ahead and casted for a [place] type orthotic provide for complete plantar foot support

## 2019-02-21 ENCOUNTER — Other Ambulatory Visit: Payer: Self-pay | Admitting: Internal Medicine

## 2019-03-18 ENCOUNTER — Other Ambulatory Visit: Payer: Self-pay

## 2019-03-18 ENCOUNTER — Ambulatory Visit: Payer: 59 | Admitting: Orthotics

## 2019-03-18 DIAGNOSIS — M779 Enthesopathy, unspecified: Secondary | ICD-10-CM

## 2019-03-18 DIAGNOSIS — M1000 Idiopathic gout, unspecified site: Secondary | ICD-10-CM

## 2019-03-18 DIAGNOSIS — B351 Tinea unguium: Secondary | ICD-10-CM

## 2019-03-18 DIAGNOSIS — M7672 Peroneal tendinitis, left leg: Secondary | ICD-10-CM

## 2019-03-18 NOTE — Progress Notes (Signed)
Patient came in today to pick up custom made foot orthotics.  The goals were accomplished and the patient reported no dissatisfaction with said orthotics.  Patient was advised of breakin period and how to report any issues. 

## 2019-05-19 ENCOUNTER — Other Ambulatory Visit: Payer: Self-pay | Admitting: Internal Medicine

## 2019-05-22 ENCOUNTER — Other Ambulatory Visit: Payer: Self-pay | Admitting: Internal Medicine

## 2019-05-22 NOTE — Telephone Encounter (Signed)
New message:   1.Medication Requested: TEKTURNA HCT 300-12.5 MG TABS ezetimibe (ZETIA) 10 MG tablet 2. Pharmacy (Name, Street, [place]): [id number] [place], [place] 3. On Med List: Yes  4. Last Visit with PCP: 02/26/18  5. Next visit date with PCP:05/29/19   Agent: Please be advised that RX refills may take up to 3 business days. We ask that you follow-up with your pharmacy.

## 2019-05-22 NOTE — Telephone Encounter (Signed)
This was refilled by Dr. Alain Marion on 05/19/19.

## 2019-05-29 ENCOUNTER — Encounter: Payer: 59 | Admitting: Internal Medicine

## 2019-05-30 ENCOUNTER — Encounter: Payer: Self-pay | Admitting: Internal Medicine

## 2019-05-30 ENCOUNTER — Ambulatory Visit (INDEPENDENT_AMBULATORY_CARE_PROVIDER_SITE_OTHER): Payer: 59 | Admitting: Internal Medicine

## 2019-05-30 ENCOUNTER — Other Ambulatory Visit: Payer: Self-pay

## 2019-05-30 VITALS — BP 168/102 | HR 104 | Temp 98.4°F | Ht 67.0 in | Wt 200.0 lb

## 2019-05-30 DIAGNOSIS — R7989 Other specified abnormal findings of blood chemistry: Secondary | ICD-10-CM

## 2019-05-30 DIAGNOSIS — I10 Essential (primary) hypertension: Secondary | ICD-10-CM | POA: Diagnosis not present

## 2019-05-30 DIAGNOSIS — Z0001 Encounter for general adult medical examination with abnormal findings: Secondary | ICD-10-CM | POA: Diagnosis not present

## 2019-05-30 DIAGNOSIS — R972 Elevated prostate specific antigen [PSA]: Secondary | ICD-10-CM

## 2019-05-30 DIAGNOSIS — E559 Vitamin D deficiency, unspecified: Secondary | ICD-10-CM

## 2019-05-30 DIAGNOSIS — Z0000 Encounter for general adult medical examination without abnormal findings: Secondary | ICD-10-CM

## 2019-05-30 DIAGNOSIS — M109 Gout, unspecified: Secondary | ICD-10-CM

## 2019-05-30 LAB — BASIC METABOLIC PANEL
BUN: 14 mg/dL (ref 6–23)
CO2: 30 mEq/L (ref 19–32)
Calcium: 10.4 mg/dL (ref 8.4–10.5)
Chloride: 95 mEq/L — ABNORMAL LOW (ref 96–112)
Creatinine, Ser: 1.13 mg/dL (ref 0.40–1.50)
GFR: 82.14 mL/min (ref >60.00)
Glucose, Bld: 96 mg/dL (ref 70–99)
Potassium: 4.7 mEq/L (ref 3.5–5.1)
Sodium: 133 mEq/L — ABNORMAL LOW (ref 135–145)

## 2019-05-30 LAB — HEPATIC FUNCTION PANEL
ALT: 131 U/L — ABNORMAL HIGH (ref 0–53)
AST: 81 U/L — ABNORMAL HIGH (ref 0–37)
Albumin: 5.2 g/dL (ref 3.5–5.2)
Alkaline Phosphatase: 58 U/L (ref 39–117)
Bilirubin, Direct: 0.2 mg/dL (ref 0.0–0.3)
Total Bilirubin: 1.2 mg/dL (ref 0.2–1.2)
Total Protein: 8.2 g/dL (ref 6.0–8.3)

## 2019-05-30 LAB — CBC WITH DIFFERENTIAL/PLATELET
Basophils Absolute: 0 10*3/uL (ref 0.0–0.1)
Basophils Relative: 0.6 % (ref 0.0–3.0)
Eosinophils Absolute: 0 10*3/uL (ref 0.0–0.7)
Eosinophils Relative: 0.4 % (ref 0.0–5.0)
HCT: 42.1 % (ref 39.0–52.0)
Hemoglobin: 14.8 g/dL (ref 13.0–17.0)
Lymphocytes Relative: 22.4 % (ref 12.0–46.0)
Lymphs Abs: 1.3 10*3/uL (ref 0.7–4.0)
MCHC: 35.2 g/dL (ref 30.0–36.0)
MCV: 91.5 fl (ref 78.0–100.0)
Monocytes Absolute: 0.5 10*3/uL (ref 0.1–1.0)
Monocytes Relative: 9 % (ref 3.0–12.0)
Neutro Abs: 3.8 10*3/uL (ref 1.4–7.7)
Neutrophils Relative %: 67.6 % (ref 43.0–77.0)
Platelets: 203 10*3/uL (ref 150.0–400.0)
RBC: 4.6 Mil/uL (ref 4.22–5.81)
RDW: 12.9 % (ref 11.5–15.5)
WBC: 5.6 10*3/uL (ref 4.0–10.5)

## 2019-05-30 LAB — URINALYSIS
Bilirubin Urine: NEGATIVE
Hgb urine dipstick: NEGATIVE
Ketones, ur: NEGATIVE
Leukocytes,Ua: NEGATIVE
Nitrite: NEGATIVE
Specific Gravity, Urine: 1.01 (ref 1.000–1.030)
Total Protein, Urine: NEGATIVE
Urine Glucose: NEGATIVE
Urobilinogen, UA: 0.2 (ref 0.0–1.0)
pH: 7 (ref 5.0–8.0)

## 2019-05-30 LAB — LIPID PANEL
Cholesterol: 275 mg/dL — ABNORMAL HIGH (ref 0–200)
HDL: 59.5 mg/dL (ref >39.00)
NonHDL: 215.67
Total CHOL/HDL Ratio: 5
Triglycerides: 264 mg/dL — ABNORMAL HIGH (ref 0.0–149.0)
VLDL: 52.8 mg/dL — ABNORMAL HIGH (ref 0.0–40.0)

## 2019-05-30 LAB — LDL CHOLESTEROL, DIRECT: Direct LDL: 172 mg/dL

## 2019-05-30 LAB — TSH: TSH: 1.55 u[IU]/mL (ref 0.35–4.50)

## 2019-05-30 LAB — PSA: PSA: 19.8 ng/mL — ABNORMAL HIGH (ref 0.10–4.00)

## 2019-05-30 MED ORDER — PRAVASTATIN SODIUM 20 MG PO TABS: 20.0000 mg | ORAL_TABLET | Freq: Every day | ORAL | 3 refills | Status: DC

## 2019-05-30 MED ORDER — VIAGRA 100 MG PO TABS: 100.0000 mg | ORAL_TABLET | ORAL | 3 refills | Status: DC | PRN

## 2019-05-30 MED ORDER — ALLOPURINOL 100 MG PO TABS: 100.0000 mg | ORAL_TABLET | Freq: Every day | ORAL | 3 refills | Status: DC

## 2019-05-30 MED ORDER — TEKTURNA HCT 300-12.5 MG PO TABS: 1.0000 | ORAL_TABLET | Freq: Every day | ORAL | 3 refills | Status: DC

## 2019-05-30 MED ORDER — EZETIMIBE 10 MG PO TABS: 10.0000 mg | ORAL_TABLET | Freq: Every day | ORAL | 3 refills | Status: DC

## 2019-05-30 NOTE — Patient Instructions (Addendum)
Dr [facility] in [facility]    FEMA mass vaccine site in [place]:  Call the [place] at [phone] to schedule your shot at [facility].   [place] federal vaccine clinic is preparing to open on Wednesday 05/15/19. The FEMA site at [facility] has the capacity to vaccinate 3,000 people a day for eight weeks. That's nearly 170,000 doses just from this one clinic.   With such a big operation, here are answers to some questions you may have about the drive-thru and indoor site:  How do I make an appointment?   Head to [email or web address] to schedule your appointment indoors or in the drive-thru, or call the [place] [facility] at [phone].  What if I need to change or cancel my appointment, or have more questions?   Call the [place] at [phone].  What vaccines will be available at the clinic?   The vaccine clinic will begin giving both Pfizer and Moderna two-dose COVID-19 vaccines. The single-dose [employer] vaccines will be given during the last two weeks of the clinic.   What part of the [place] do I enter for my appointment?  Enter from [employer] and turn onto [address]. A clinic staff member will confirm your appointment for the day. You'll then either be directed to registration or to a waiting area until your appointment time.  Can I be seen sooner if I'm early?  Those who are early will park in a designated waiting area until their vaccine appointment time approaches.  How does registration work?  There are five open lanes for registration. Someone will get the necessary information needed to confirm appointments and other details to keep a record of who's getting the vaccine every day. Temperatures will be checked to make sure you're in good shape to get the vaccine.  How long will it take to get my shot?  [place] park your car  in a long tent with about 10 other vehicles. All 10 people in that group will get a shot inside their vehicles. Getting the actual shot only takes a few minutes. The entire process takes about 30 minutes.  How does the observation period work?  All patients will wait in the same tent they got their vaccine at for 15 minutes.

## 2019-05-30 NOTE — Assessment & Plan Note (Signed)
Vit D 

## 2019-05-30 NOTE — Assessment & Plan Note (Addendum)
We discussed age appropriate health related issues, including available/recomended screening tests and vaccinations. We discussed a need for adhering to healthy diet and exercise. Labs were ordered to be later reviewed . All questions were answered. Coronary calcium score of 62.4. This was 95 percentile for age and sex matched control.

## 2019-05-30 NOTE — Assessment & Plan Note (Addendum)
PSA He has been seeing Dr. Karsten Ro who is planning to retire.  He will need to see another urologist then.

## 2019-05-30 NOTE — Progress Notes (Signed)
Subjective:  Patient ID: Joseph Livingston, male    DOB: 06-Dec-1965  Age: 54 y.o. MRN: [id number]  CC: No chief complaint on file.   HPI Joseph Livingston presents for a well exam BP nl at home Follow-up on dyslipidemia, elevated PSA, elevated liver function tests He had to prostate MRIs with Dr. Karsten Ro  Outpatient Medications Prior to Visit  Medication Sig Dispense Refill  . allopurinol (ZYLOPRIM) 100 MG tablet TAKE 1 TABLET BY MOUTH EVERY DAY 30 tablet 6  . AMBULATORY NON FORMULARY MEDICATION Medication Name: whey powder    . AMBULATORY NON FORMULARY MEDICATION Medication Name: tumeric    . aspirin 81 MG tablet Take 81 mg by mouth daily.    . Cholecalciferol (VITAMIN D3) 2000 units capsule Take 1 capsule (2,000 Units total) by mouth daily. 100 capsule 3  . diclofenac (VOLTAREN) 75 MG EC tablet Take 1 tablet (75 mg total) by mouth 2 (two) times daily. 50 tablet 2  . ezetimibe (ZETIA) 10 MG tablet TAKE 1 TABLET (10 MG TOTAL) BY MOUTH DAILY. OFFICE VISIT NEEDED BEFORE REFILLS WILL BE GIVEN 90 tablet 0  . MELATONIN PO Take by mouth at bedtime.    . Na Sulfate-K Sulfate-Mg Sulf 17.5-3.13-1.6 GM/177ML SOLN Suprep-Use as directed 354 mL 0  . omega-3 acid ethyl esters (LOVAZA) 1 g capsule Take 2 g by mouth 2 (two) times daily.    . pravastatin (PRAVACHOL) 20 MG tablet TAKE 1 TABLET (20 MG TOTAL) BY MOUTH DAILY. PATIENT NEEDS OFFICE VISIT BEFORE REFILLS WILL BE GIVEN 90 tablet 1  . SUPREP BOWEL PREP KIT 17.5-3.13-1.6 GM/177ML SOLN Suprep-Use as directed 354 mL 0  . TEKTURNA HCT 300-12.5 MG TABS TAKE 1 TABLET BY MOUTH DAILY. OFFICE VISIT NEEDED BEFORE REFILLS WILL BE GIVEN 90 tablet 0  . VIAGRA 100 MG tablet Take 1 tablet (100 mg total) by mouth as needed. 10 tablet 11   No facility-administered medications prior to visit.    ROS: Review of Systems  Constitutional: Negative for appetite change, fatigue and unexpected weight change.  HENT: Negative for congestion, nosebleeds,  sneezing, sore throat and trouble swallowing.   Eyes: Negative for itching and visual disturbance.  Respiratory: Negative for cough.   Cardiovascular: Negative for chest pain, palpitations and leg swelling.  Gastrointestinal: Negative for abdominal distention, blood in stool, diarrhea and nausea.  Genitourinary: Negative for frequency and hematuria.  Musculoskeletal: Negative for back pain, gait problem, joint swelling and neck pain.  Skin: Negative for rash.  Neurological: Negative for dizziness, tremors, speech difficulty and weakness.  Psychiatric/Behavioral: Negative for agitation, dysphoric mood and sleep disturbance. The patient is not nervous/anxious.     Objective:  BP (!) 168/102 (BP Location: Left Arm, Patient Position: Sitting, Cuff Size: Large)   Pulse (!) 104   Temp 98.4 F (36.9 C) (Oral)   Ht '5\' 7"'  (1.702 m)   Wt 200 lb (90.7 kg)   SpO2 98%   BMI 31.32 kg/m   BP Readings from Last 3 Encounters:  05/30/19 (!) 168/102  02/26/18 120/72  10/27/17 (!) 126/92    Wt Readings from Last 3 Encounters:  05/30/19 200 lb (90.7 kg)  02/26/18 194 lb (88 kg)  10/27/17 188 lb 2 oz (85.3 kg)    Physical Exam Constitutional:      General: He is not in acute distress.    Appearance: He is well-developed.     Comments: NAD  Eyes:     Conjunctiva/sclera: Conjunctivae normal.  Pupils: Pupils are equal, round, and reactive to light.  Neck:     Thyroid: No thyromegaly.     Vascular: No JVD.  Cardiovascular:     Rate and Rhythm: Normal rate and regular rhythm.     Heart sounds: Normal heart sounds. No murmur. No friction rub. No gallop.   Pulmonary:     Effort: Pulmonary effort is normal. No respiratory distress.     Breath sounds: Normal breath sounds. No wheezing or rales.  Chest:     Chest wall: No tenderness.  Abdominal:     General: Bowel sounds are normal. There is no distension.     Palpations: Abdomen is soft. There is no mass.     Tenderness: There is no  abdominal tenderness. There is no guarding or rebound.  Musculoskeletal:        General: No tenderness. Normal range of motion.     Cervical back: Normal range of motion.  Lymphadenopathy:     Cervical: No cervical adenopathy.  Skin:    General: Skin is warm and dry.     Findings: No rash.  Neurological:     Mental Status: He is alert and oriented to person, place, and time.     Cranial Nerves: No cranial nerve deficit.     Motor: No abnormal muscle tone.     Coordination: Coordination normal.     Gait: Gait normal.     Deep Tendon Reflexes: Reflexes are normal and symmetric.  Psychiatric:        Behavior: Behavior normal.        Thought Content: Thought content normal.        Judgment: Judgment normal.     Lab Results  Component Value Date   WBC 5.2 02/26/2018   HGB 15.1 02/26/2018   HCT 44.2 02/26/2018   PLT 257.0 02/26/2018   GLUCOSE 91 02/26/2018   CHOL 311 (H) 02/26/2018   TRIG (H) 02/26/2018    457.0 Triglyceride is over 400; calculations on Lipids are invalid.   HDL 57.60 02/26/2018   LDLDIRECT 168.0 02/26/2018   ALT 79 (H) 02/26/2018   AST 57 (H) 02/26/2018   NA 135 02/26/2018   K 4.1 02/26/2018   CL 95 (L) 02/26/2018   CREATININE 1.27 02/26/2018   BUN 14 02/26/2018   CO2 27 02/26/2018   TSH 1.67 02/26/2018   PSA 9.61 (H) 11/25/2015    MR PROSTATE W WO CONTRAST  Result Date: 01/01/2019 CLINICAL DATA:  Elevated PSA. 20.8 most recent. Prior negative biopsy. EXAM: MR PROSTATE WITHOUT AND WITH CONTRAST TECHNIQUE: Multiplanar multisequence MRI images were obtained of the pelvis centered about the prostate. Pre and post contrast images were obtained. CONTRAST:  44m MULTIHANCE GADOBENATE DIMEGLUMINE 529 MG/ML IV SOLN COMPARISON:  07/25/2017 FINDINGS: Prostate: Demonstrates relatively mild central gland enlargement and heterogeneity, consistent with benign prostatic hyperplasia. Right mid gland well-circumscribed central gland nodule of 1.9 cm is similar to on the  prior exam and has benign characteristics ( PI-RADS(v2.1)-) 2.). Felt to be similar to on the prior exam. No areas of restricted diffusion. Relatively diffuse early post-contrast enhancement throughout the peripheral zone, including on series 26. Volume: 4.3 x 3.3 x 3.9 cm (volume = 29 cm^3) Transcapsular spread:  Absent Seminal vesicle involvement: Absent Neurovascular bundle involvement: Absent Pelvic adenopathy: Absent Bone metastasis: Absent Other findings: No significant free fluid. Normal urinary bladder. Small fat containing right inguinal hernia. Trace bilateral scrotal fluid is likely physiologic. IMPRESSION: 1. No findings to suggest high-grade  or macroscopic prostate carcinoma. 2. Diffuse T2 hypointensity and early post-contrast enhancement throughout the peripheral zone, relatively similar and suspicious for prostatitis. Electronically Signed   By: Abigail Miyamoto M.D.   On: 01/01/2019 13:45    Assessment & Plan:   There are no diagnoses linked to this encounter.   No orders of the defined types were placed in this encounter.    Follow-up: No follow-ups on file.  Walker Kehr, MD

## 2019-05-31 ENCOUNTER — Telehealth: Payer: Self-pay | Admitting: Emergency Medicine

## 2019-05-31 NOTE — Telephone Encounter (Signed)
Pt called and is requesting a work from his appt yesterday. Can one be printed off, signed and faxed to [phone].

## 2019-05-31 NOTE — Telephone Encounter (Signed)
Letter has been completed

## 2019-06-01 ENCOUNTER — Other Ambulatory Visit: Payer: Self-pay | Admitting: Internal Medicine

## 2019-06-01 MED ORDER — PRAVASTATIN SODIUM 20 MG PO TABS: 20.0000 mg | ORAL_TABLET | Freq: Every day | ORAL | 3 refills | Status: DC

## 2019-06-01 MED ORDER — EZETIMIBE 10 MG PO TABS: 10.0000 mg | ORAL_TABLET | Freq: Every day | ORAL | 3 refills | Status: DC

## 2019-06-01 MED ORDER — TEKTURNA HCT 300-12.5 MG PO TABS: 1.0000 | ORAL_TABLET | Freq: Every day | ORAL | 3 refills | Status: DC

## 2019-06-02 ENCOUNTER — Encounter: Payer: Self-pay | Admitting: Internal Medicine

## 2019-06-02 NOTE — Assessment & Plan Note (Signed)
It could be related to statin use.  Continue to monitor LFTs

## 2019-06-02 NOTE — Assessment & Plan Note (Signed)
No relapse 

## 2019-06-02 NOTE — Assessment & Plan Note (Signed)
  BP Readings from Last 3 Encounters:  05/30/19 (!) 168/102  02/26/18 120/72  10/27/17 (!) 126/92   On Tekturna HCT.  Check blood pressure at home.  Follow-up with Dr. Einar Gip

## 2019-06-24 ENCOUNTER — Other Ambulatory Visit: Payer: Self-pay | Admitting: Internal Medicine

## 2019-06-24 MED ORDER — VALACYCLOVIR HCL 500 MG PO TABS: 500.0000 mg | ORAL_TABLET | Freq: Two times a day (BID) | ORAL | 0 refills | Status: DC

## 2019-07-15 ENCOUNTER — Other Ambulatory Visit: Payer: Self-pay | Admitting: Internal Medicine

## 2019-07-16 NOTE — Telephone Encounter (Signed)
RX sent through RX encounter

## 2019-07-18 ENCOUNTER — Telehealth: Payer: Self-pay

## 2019-07-18 NOTE — Telephone Encounter (Signed)
Pt notified to contact pharmacy to get refills

## 2019-07-18 NOTE — Telephone Encounter (Signed)
1.Medication Requested:  VIAGRA 100 MG tablet  ezetimibe (ZETIA) 10 MG tablet  2. Pharmacy (Name, [place], [place]):[id number] - [place], [place]  3. On Med List: Yes   4. Last Visit with PCP: 3.25.2021   5. Next visit date with PCP: N/a   Agent: Please be advised that RX refills may take up to 3 business days. We ask that you follow-up with your pharmacy.

## 2019-07-23 ENCOUNTER — Other Ambulatory Visit: Payer: Self-pay

## 2019-07-23 ENCOUNTER — Ambulatory Visit: Payer: 59 | Admitting: Internal Medicine

## 2019-07-23 ENCOUNTER — Encounter: Payer: Self-pay | Admitting: Internal Medicine

## 2019-07-23 VITALS — BP 162/98 | HR 96 | Temp 98.5°F | Ht 67.0 in | Wt 199.0 lb

## 2019-07-23 DIAGNOSIS — R972 Elevated prostate specific antigen [PSA]: Secondary | ICD-10-CM

## 2019-07-23 DIAGNOSIS — I10 Essential (primary) hypertension: Secondary | ICD-10-CM | POA: Diagnosis not present

## 2019-07-23 DIAGNOSIS — R7989 Other specified abnormal findings of blood chemistry: Secondary | ICD-10-CM

## 2019-07-23 DIAGNOSIS — I2510 Atherosclerotic heart disease of native coronary artery without angina pectoris: Secondary | ICD-10-CM | POA: Insufficient documentation

## 2019-07-23 DIAGNOSIS — E785 Hyperlipidemia, unspecified: Secondary | ICD-10-CM

## 2019-07-23 DIAGNOSIS — I2583 Coronary atherosclerosis due to lipid rich plaque: Secondary | ICD-10-CM

## 2019-07-23 DIAGNOSIS — E559 Vitamin D deficiency, unspecified: Secondary | ICD-10-CM

## 2019-07-23 MED ORDER — PRAVASTATIN SODIUM 40 MG PO TABS: 40.0000 mg | ORAL_TABLET | Freq: Every day | ORAL | 3 refills | Status: DC

## 2019-07-23 MED ORDER — AMLODIPINE BESYLATE 2.5 MG PO TABS: 2.5000 mg | ORAL_TABLET | Freq: Every day | ORAL | 11 refills | Status: DC

## 2019-07-23 NOTE — Assessment & Plan Note (Signed)
Cut back on alcohol - weekends

## 2019-07-23 NOTE — Assessment & Plan Note (Signed)
  CT calc score is 62 in 2020 Pravastatin and Zetia

## 2019-07-23 NOTE — Progress Notes (Signed)
Subjective:  Patient ID: Joseph Livingston, male    DOB: November 26, 1965  Age: 54 y.o. MRN: [id number]  CC: No chief complaint on file.   HPI Joseph Livingston presents for elevated BP Working 8-8 m-f plus wknds; working 60-70 h wk SBP 140-160 at home F/u elevated LFTs,PSA   Outpatient Medications Prior to Visit  Medication Sig Dispense Refill  . Aliskiren-hydroCHLOROthiazide (TEKTURNA HCT) 300-12.5 MG TABS Take 1 tablet by mouth daily. 90 tablet 3  . allopurinol (ZYLOPRIM) 100 MG tablet Take 1 tablet (100 mg total) by mouth daily. 90 tablet 3  . AMBULATORY NON FORMULARY MEDICATION Medication Name: whey powder    . AMBULATORY NON FORMULARY MEDICATION Medication Name: tumeric    . aspirin 81 MG tablet Take 81 mg by mouth daily.    . Cholecalciferol (VITAMIN D3) 2000 units capsule Take 1 capsule (2,000 Units total) by mouth daily. 100 capsule 3  . diclofenac (VOLTAREN) 75 MG EC tablet Take 1 tablet (75 mg total) by mouth 2 (two) times daily. 50 tablet 2  . ezetimibe (ZETIA) 10 MG tablet Take 1 tablet (10 mg total) by mouth daily. 90 tablet 3  . MELATONIN PO Take by mouth at bedtime.    Marland Kitchen omega-3 acid ethyl esters (LOVAZA) 1 g capsule Take 2 g by mouth 2 (two) times daily.    . pravastatin (PRAVACHOL) 20 MG tablet Take 1 tablet (20 mg total) by mouth daily. 90 tablet 3  . valACYclovir (VALTREX) 500 MG tablet Take 1 tablet (500 mg total) by mouth 2 (two) times daily. 14 tablet 0  . VIAGRA 100 MG tablet TAKE 1 TABLET (100 MG TOTAL) BY MOUTH AS NEEDED. 6 tablet 19  . Na Sulfate-K Sulfate-Mg Sulf 17.5-3.13-1.6 GM/177ML SOLN Suprep-Use as directed 354 mL 0  . SUPREP BOWEL PREP KIT 17.5-3.13-1.6 GM/177ML SOLN Suprep-Use as directed 354 mL 0   No facility-administered medications prior to visit.    ROS: Review of Systems  Constitutional: Positive for fatigue. Negative for appetite change and unexpected weight change.  HENT: Negative for congestion, nosebleeds, sneezing, sore throat and  trouble swallowing.   Eyes: Negative for itching and visual disturbance.  Respiratory: Negative for cough.   Cardiovascular: Negative for chest pain, palpitations and leg swelling.  Gastrointestinal: Negative for abdominal distention, blood in stool, diarrhea and nausea.  Genitourinary: Negative for frequency and hematuria.  Musculoskeletal: Negative for back pain, gait problem, joint swelling and neck pain.  Skin: Negative for rash.  Neurological: Negative for dizziness, tremors, speech difficulty and weakness.  Psychiatric/Behavioral: Negative for agitation, dysphoric mood and sleep disturbance. The patient is not nervous/anxious.     Objective:  BP (!) 162/98 (BP Location: Left Arm, Patient Position: Sitting, Cuff Size: Large)   Pulse 96   Temp 98.5 F (36.9 C) (Oral)   Ht '5\' 7"'  (1.702 m)   Wt 199 lb (90.3 kg)   SpO2 98%   BMI 31.17 kg/m   BP Readings from Last 3 Encounters:  07/23/19 (!) 162/98  05/30/19 (!) 168/102  02/26/18 120/72    Wt Readings from Last 3 Encounters:  07/23/19 199 lb (90.3 kg)  05/30/19 200 lb (90.7 kg)  02/26/18 194 lb (88 kg)    Physical Exam Constitutional:      General: He is not in acute distress.    Appearance: He is well-developed.     Comments: NAD  Eyes:     Conjunctiva/sclera: Conjunctivae normal.     Pupils: Pupils are equal, round, and  reactive to light.  Neck:     Thyroid: No thyromegaly.     Vascular: No JVD.  Cardiovascular:     Rate and Rhythm: Normal rate and regular rhythm.     Heart sounds: Normal heart sounds. No murmur. No friction rub. No gallop.   Pulmonary:     Effort: Pulmonary effort is normal. No respiratory distress.     Breath sounds: Normal breath sounds. No wheezing or rales.  Chest:     Chest wall: No tenderness.  Abdominal:     General: Bowel sounds are normal. There is no distension.     Palpations: Abdomen is soft. There is no mass.     Tenderness: There is no abdominal tenderness. There is no  guarding or rebound.  Musculoskeletal:        General: No tenderness. Normal range of motion.     Cervical back: Normal range of motion.  Lymphadenopathy:     Cervical: No cervical adenopathy.  Skin:    General: Skin is warm and dry.     Findings: No rash.  Neurological:     Mental Status: He is alert and oriented to person, place, and time.     Cranial Nerves: No cranial nerve deficit.     Motor: No abnormal muscle tone.     Coordination: Coordination normal.     Gait: Gait normal.     Deep Tendon Reflexes: Reflexes are normal and symmetric.  Psychiatric:        Behavior: Behavior normal.        Thought Content: Thought content normal.        Judgment: Judgment normal.     Lab Results  Component Value Date   WBC 5.6 05/30/2019   HGB 14.8 05/30/2019   HCT 42.1 05/30/2019   PLT 203.0 05/30/2019   GLUCOSE 96 05/30/2019   CHOL 275 (H) 05/30/2019   TRIG 264.0 (H) 05/30/2019   HDL 59.50 05/30/2019   LDLDIRECT 172.0 05/30/2019   ALT 131 (H) 05/30/2019   AST 81 (H) 05/30/2019   NA 133 (L) 05/30/2019   K 4.7 05/30/2019   CL 95 (L) 05/30/2019   CREATININE 1.13 05/30/2019   BUN 14 05/30/2019   CO2 30 05/30/2019   TSH 1.55 05/30/2019   PSA 19.80 (H) 05/30/2019    MR PROSTATE W WO CONTRAST  Result Date: 01/01/2019 CLINICAL DATA:  Elevated PSA. 20.8 most recent. Prior negative biopsy. EXAM: MR PROSTATE WITHOUT AND WITH CONTRAST TECHNIQUE: Multiplanar multisequence MRI images were obtained of the pelvis centered about the prostate. Pre and post contrast images were obtained. CONTRAST:  67m MULTIHANCE GADOBENATE DIMEGLUMINE 529 MG/ML IV SOLN COMPARISON:  07/25/2017 FINDINGS: Prostate: Demonstrates relatively mild central gland enlargement and heterogeneity, consistent with benign prostatic hyperplasia. Right mid gland well-circumscribed central gland nodule of 1.9 cm is similar to on the prior exam and has benign characteristics ( PI-RADS(v2.1)-) 2.). Felt to be similar to on the  prior exam. No areas of restricted diffusion. Relatively diffuse early post-contrast enhancement throughout the peripheral zone, including on series 26. Volume: 4.3 x 3.3 x 3.9 cm (volume = 29 cm^3) Transcapsular spread:  Absent Seminal vesicle involvement: Absent Neurovascular bundle involvement: Absent Pelvic adenopathy: Absent Bone metastasis: Absent Other findings: No significant free fluid. Normal urinary bladder. Small fat containing right inguinal hernia. Trace bilateral scrotal fluid is likely physiologic. IMPRESSION: 1. No findings to suggest high-grade or macroscopic prostate carcinoma. 2. Diffuse T2 hypointensity and early post-contrast enhancement throughout the peripheral zone, relatively  similar and suspicious for prostatitis. Electronically Signed   By: Abigail Miyamoto M.D.   On: 01/01/2019 13:45    Assessment & Plan:    Follow-up: No follow-ups on file.  Walker Kehr, MD

## 2019-07-23 NOTE — Patient Instructions (Signed)
Valerian root for insomnia 

## 2019-07-23 NOTE — Assessment & Plan Note (Signed)
Vit D 

## 2019-07-23 NOTE — Assessment & Plan Note (Signed)
Worse Add Amlodipine NAS diet Work less ContTekturna HCT CT calc score is 62 in 2020 Pravastatin and Zetia

## 2019-07-23 NOTE — Assessment & Plan Note (Addendum)
2020 IMPRESSION: Coronary calcium score of 62.4. This was 88 percentile for age and sex matched control. Increase Pravastatin and continue Zetia

## 2019-11-04 LAB — COLOGUARD: Cologuard: NEGATIVE

## 2019-11-13 NOTE — Telephone Encounter (Signed)
Scheduled pt an OV for 9/23 @ 3:20 pm.  What labs did you want pt to have done so he can do them prior to?

## 2019-11-13 NOTE — Telephone Encounter (Signed)
Left pt a vm to call the office and schedule an office visit

## 2019-11-27 ENCOUNTER — Other Ambulatory Visit: Payer: Self-pay

## 2019-11-27 ENCOUNTER — Other Ambulatory Visit: Payer: 59

## 2019-11-27 DIAGNOSIS — R972 Elevated prostate specific antigen [PSA]: Secondary | ICD-10-CM

## 2019-11-27 DIAGNOSIS — I10 Essential (primary) hypertension: Secondary | ICD-10-CM

## 2019-11-27 LAB — BASIC METABOLIC PANEL
BUN: 13 mg/dL (ref 7–25)
CO2: 28 mmol/L (ref 20–32)
Calcium: 10.5 mg/dL — ABNORMAL HIGH (ref 8.6–10.3)
Chloride: 94 mmol/L — ABNORMAL LOW (ref 98–110)
Creat: 1.09 mg/dL (ref 0.70–1.33)
Glucose, Bld: 111 mg/dL — ABNORMAL HIGH (ref 65–99)
Potassium: 4.4 mmol/L (ref 3.5–5.3)
Sodium: 132 mmol/L — ABNORMAL LOW (ref 135–146)

## 2019-11-27 NOTE — Addendum Note (Signed)
Addended by: Hazle Quant on: 11/27/2019 08:04 AM   Modules accepted: Orders

## 2019-11-28 ENCOUNTER — Ambulatory Visit (INDEPENDENT_AMBULATORY_CARE_PROVIDER_SITE_OTHER): Payer: 59 | Admitting: Internal Medicine

## 2019-11-28 ENCOUNTER — Other Ambulatory Visit: Payer: Self-pay

## 2019-11-28 ENCOUNTER — Encounter: Payer: Self-pay | Admitting: Internal Medicine

## 2019-11-28 VITALS — BP 144/88 | HR 86 | Temp 98.7°F | Ht 67.0 in | Wt 188.0 lb

## 2019-11-28 DIAGNOSIS — R7989 Other specified abnormal findings of blood chemistry: Secondary | ICD-10-CM | POA: Diagnosis not present

## 2019-11-28 DIAGNOSIS — E785 Hyperlipidemia, unspecified: Secondary | ICD-10-CM

## 2019-11-28 DIAGNOSIS — I10 Essential (primary) hypertension: Secondary | ICD-10-CM

## 2019-11-28 DIAGNOSIS — Z0279 Encounter for issue of other medical certificate: Secondary | ICD-10-CM

## 2019-11-28 DIAGNOSIS — R7309 Other abnormal glucose: Secondary | ICD-10-CM

## 2019-11-28 DIAGNOSIS — R972 Elevated prostate specific antigen [PSA]: Secondary | ICD-10-CM | POA: Diagnosis not present

## 2019-11-28 LAB — PSA: PSA: 20.45 ng/mL — ABNORMAL HIGH (ref <=4.0)

## 2019-11-28 MED ORDER — VIAGRA 100 MG PO TABS
100.0000 mg | ORAL_TABLET | ORAL | 11 refills | Status: DC | PRN
Start: 2019-11-28 — End: 2020-08-10

## 2019-11-28 NOTE — Progress Notes (Signed)
Subjective:  Patient ID: Joseph Livingston, male    DOB: [birth year]  Age: 54 y.o. MRN: [id number]  CC: No chief complaint on file.   HPI Joseph Livingston presents for elevated LFTs, HTN, elev lipids, CAD Follow-up to review abnormal labs Working 8-8 shift  6-7 d/wk  Outpatient Medications Prior to Visit  Medication Sig Dispense Refill  . Aliskiren-hydroCHLOROthiazide (TEKTURNA HCT) 300-12.5 MG TABS Take 1 tablet by mouth daily. 90 tablet 3  . allopurinol (ZYLOPRIM) 100 MG tablet Take 1 tablet (100 mg total) by mouth daily. 90 tablet 3  . AMBULATORY NON FORMULARY MEDICATION Medication Name: whey powder    . AMBULATORY NON FORMULARY MEDICATION Medication Name: tumeric    . amLODipine (NORVASC) 2.5 MG tablet Take 1 tablet (2.5 mg total) by mouth daily. 30 tablet 11  . aspirin 81 MG tablet Take 81 mg by mouth daily.    . Cholecalciferol (VITAMIN D3) 2000 units capsule Take 1 capsule (2,000 Units total) by mouth daily. 100 capsule 3  . diclofenac (VOLTAREN) 75 MG EC tablet Take 1 tablet (75 mg total) by mouth 2 (two) times daily. 50 tablet 2  . ezetimibe (ZETIA) 10 MG tablet Take 1 tablet (10 mg total) by mouth daily. 90 tablet 3  . MELATONIN PO Take by mouth at bedtime.    Marland Kitchen omega-3 acid ethyl esters (LOVAZA) 1 g capsule Take 2 g by mouth 2 (two) times daily.    . pravastatin (PRAVACHOL) 40 MG tablet Take 1 tablet (40 mg total) by mouth daily. 90 tablet 3  . valACYclovir (VALTREX) 500 MG tablet Take 1 tablet (500 mg total) by mouth 2 (two) times daily. 14 tablet 0  . VIAGRA 100 MG tablet TAKE 1 TABLET (100 MG TOTAL) BY MOUTH AS NEEDED. 6 tablet 19   No facility-administered medications prior to visit.    ROS: Review of Systems  Constitutional: Negative for appetite change, fatigue and unexpected weight change.  HENT: Negative for congestion, nosebleeds, sneezing, sore throat and trouble swallowing.   Eyes: Negative for itching and visual disturbance.  Respiratory: Negative  for cough.   Cardiovascular: Negative for chest pain, palpitations and leg swelling.  Gastrointestinal: Negative for abdominal distention, blood in stool, diarrhea and nausea.  Genitourinary: Negative for frequency and hematuria.  Musculoskeletal: Negative for back pain, gait problem, joint swelling and neck pain.  Skin: Negative for rash.  Neurological: Negative for dizziness, tremors, speech difficulty and weakness.  Psychiatric/Behavioral: Negative for agitation, dysphoric mood, sleep disturbance and suicidal ideas. The patient is not nervous/anxious.     Objective:  BP (!) 144/88   Pulse 86   Temp 98.7 F (37.1 C) (Oral)   Ht 5\' 7"  (1.702 m)   Wt 188 lb (85.3 kg)   SpO2 99%   BMI 29.44 kg/m   BP Readings from Last 3 Encounters:  11/28/19 (!) 144/88  07/23/19 (!) 162/98  05/30/19 (!) 168/102    Wt Readings from Last 3 Encounters:  11/28/19 188 lb (85.3 kg)  07/23/19 199 lb (90.3 kg)  05/30/19 200 lb (90.7 kg)    Physical Exam Constitutional:      General: He is not in acute distress.    Appearance: He is well-developed.     Comments: NAD  Eyes:     Conjunctiva/sclera: Conjunctivae normal.     Pupils: Pupils are equal, round, and reactive to light.  Neck:     Thyroid: No thyromegaly.     Vascular: No JVD.  Cardiovascular:  Rate and Rhythm: Normal rate and regular rhythm.     Heart sounds: Normal heart sounds. No murmur heard.  No friction rub. No gallop.   Pulmonary:     Effort: Pulmonary effort is normal. No respiratory distress.     Breath sounds: Normal breath sounds. No wheezing or rales.  Chest:     Chest wall: No tenderness.  Abdominal:     General: Bowel sounds are normal. There is no distension.     Palpations: Abdomen is soft. There is no mass.     Tenderness: There is no abdominal tenderness. There is no guarding or rebound.  Musculoskeletal:        General: No tenderness. Normal range of motion.     Cervical back: Normal range of motion.    Lymphadenopathy:     Cervical: No cervical adenopathy.  Skin:    General: Skin is warm and dry.     Findings: No rash.  Neurological:     Mental Status: He is alert and oriented to person, place, and time.     Cranial Nerves: No cranial nerve deficit.     Motor: No abnormal muscle tone.     Coordination: Coordination normal.     Gait: Gait normal.     Deep Tendon Reflexes: Reflexes are normal and symmetric.  Psychiatric:        Behavior: Behavior normal.        Thought Content: Thought content normal.        Judgment: Judgment normal.     FMLA filled out    A total time of >45 minutes was spent preparing to see the patient, reviewing tests, x-rays, operative reports and outside records.  Also, obtaining history and performing comprehensive physical exam.  Additionally, counseling the patient regarding the above listed issues.   Finally, documenting clinical information in the health records, coordination of care, educating the patient, feeling out the form. It is a complex case.    Lab Results  Component Value Date   WBC 5.6 05/30/2019   HGB 14.8 05/30/2019   HCT 42.1 05/30/2019   PLT 203.0 05/30/2019   GLUCOSE 111 (H) 11/27/2019   CHOL 275 (H) 05/30/2019   TRIG 264.0 (H) 05/30/2019   HDL 59.50 05/30/2019   LDLDIRECT 172.0 05/30/2019   ALT 131 (H) 05/30/2019   AST 81 (H) 05/30/2019   NA 132 (L) 11/27/2019   K 4.4 11/27/2019   CL 94 (L) 11/27/2019   CREATININE 1.09 11/27/2019   BUN 13 11/27/2019   CO2 28 11/27/2019   TSH 1.55 05/30/2019   PSA 20.45 (H) 11/27/2019    MR PROSTATE W WO CONTRAST  Result Date: 01/01/2019 CLINICAL DATA:  Elevated PSA. 20.8 most recent. Prior negative biopsy. EXAM: MR PROSTATE WITHOUT AND WITH CONTRAST TECHNIQUE: Multiplanar multisequence MRI images were obtained of the pelvis centered about the prostate. Pre and post contrast images were obtained. CONTRAST:  72mL MULTIHANCE GADOBENATE DIMEGLUMINE 529 MG/ML IV SOLN COMPARISON:  07/25/2017  FINDINGS: Prostate: Demonstrates relatively mild central gland enlargement and heterogeneity, consistent with benign prostatic hyperplasia. Right mid gland well-circumscribed central gland nodule of 1.9 cm is similar to on the prior exam and has benign characteristics ( PI-RADS(v2.1)-) 2.). Felt to be similar to on the prior exam. No areas of restricted diffusion. Relatively diffuse early post-contrast enhancement throughout the peripheral zone, including on series 26. Volume: 4.3 x 3.3 x 3.9 cm (volume = 29 cm^3) Transcapsular spread:  Absent Seminal vesicle involvement: Absent Neurovascular bundle involvement:  Absent Pelvic adenopathy: Absent Bone metastasis: Absent Other findings: No significant free fluid. Normal urinary bladder. Small fat containing right inguinal hernia. Trace bilateral scrotal fluid is likely physiologic. IMPRESSION: 1. No findings to suggest high-grade or macroscopic prostate carcinoma. 2. Diffuse T2 hypointensity and early post-contrast enhancement throughout the peripheral zone, relatively similar and suspicious for prostatitis. Electronically Signed   By: Abigail Miyamoto M.D.   On: 01/01/2019 13:45    Assessment & Plan:    Walker Kehr, MD

## 2019-11-29 ENCOUNTER — Telehealth: Payer: Self-pay | Admitting: Internal Medicine

## 2019-11-29 NOTE — Telephone Encounter (Signed)
FMLA forms have been completed &Signed, Faxed to [phone], Copy sent to scan &Charged for.

## 2020-01-16 NOTE — Telephone Encounter (Signed)
Please advise 

## 2020-03-12 ENCOUNTER — Telehealth: Payer: Self-pay | Admitting: Podiatry

## 2020-03-12 NOTE — Telephone Encounter (Signed)
Patient has requested refill allopurinol and is currently out, Please Advise

## 2020-03-13 ENCOUNTER — Other Ambulatory Visit: Payer: Self-pay | Admitting: Podiatry

## 2020-03-13 MED ORDER — ALLOPURINOL 100 MG PO TABS: 100.0000 mg | ORAL_TABLET | Freq: Every day | ORAL | 6 refills | Status: DC

## 2020-03-13 NOTE — Telephone Encounter (Signed)
Sent in. He should follow up with family Doc for new prescription

## 2020-05-06 ENCOUNTER — Other Ambulatory Visit: Payer: Self-pay

## 2020-05-06 ENCOUNTER — Encounter: Payer: Self-pay | Admitting: Podiatry

## 2020-05-06 ENCOUNTER — Ambulatory Visit (INDEPENDENT_AMBULATORY_CARE_PROVIDER_SITE_OTHER): Payer: 59

## 2020-05-06 ENCOUNTER — Ambulatory Visit: Payer: 59 | Admitting: Podiatry

## 2020-05-06 DIAGNOSIS — M1000 Idiopathic gout, unspecified site: Secondary | ICD-10-CM | POA: Diagnosis not present

## 2020-05-06 DIAGNOSIS — M779 Enthesopathy, unspecified: Secondary | ICD-10-CM | POA: Diagnosis not present

## 2020-05-06 MED ORDER — TRIAMCINOLONE ACETONIDE 10 MG/ML IJ SUSP: 10.0000 mg | Freq: Once | INTRAMUSCULAR | Status: DC

## 2020-05-06 NOTE — Progress Notes (Signed)
Subjective:   Patient ID: Joseph Livingston, male   DOB: 55 y.o.   MRN: [id number]   HPI Patient presents stating that he has had a lot of pain is developed in his left ankle big toe joint and that when he works extended hours it seems to worsen.  States has had history of chronic gout that he thinks contributes to the problems   ROS      Objective:  Physical Exam  Neurovascular status intact with patient found to have quite a bit of inflammation sinus tarsi left with also some inflammation of the big toe joint left and multiple indications that he has been very active on his feet and has an the increased activity causes a lot of his pain and flareups of his gout     Assessment:  2 separate problems with 1 being the probability for inflammatory gout and capsulitis of the ankle joint left     Plan:  H&P both conditions x-rays reviewed and today I did sterile prep injected the sinus tarsi and into the lateral ankle gutter 3 mg Kenalog 5 mg Xylocaine discussed his gout reviewed diet and the continuation of at times taking time off to let his feet rest.  Patient will be seen back to recheck as needed  X-rays indicate that there is no increase in osteolysis or indications of bony destruction

## 2020-06-24 ENCOUNTER — Other Ambulatory Visit (INDEPENDENT_AMBULATORY_CARE_PROVIDER_SITE_OTHER): Payer: 59

## 2020-06-24 DIAGNOSIS — E785 Hyperlipidemia, unspecified: Secondary | ICD-10-CM

## 2020-06-24 DIAGNOSIS — I10 Essential (primary) hypertension: Secondary | ICD-10-CM

## 2020-06-24 DIAGNOSIS — R972 Elevated prostate specific antigen [PSA]: Secondary | ICD-10-CM | POA: Diagnosis not present

## 2020-06-24 DIAGNOSIS — R7309 Other abnormal glucose: Secondary | ICD-10-CM

## 2020-06-24 DIAGNOSIS — R7989 Other specified abnormal findings of blood chemistry: Secondary | ICD-10-CM

## 2020-06-24 LAB — CBC WITH DIFFERENTIAL/PLATELET
Basophils Absolute: 0 10*3/uL (ref 0.0–0.1)
Basophils Relative: 0.8 % (ref 0.0–3.0)
Eosinophils Absolute: 0.1 10*3/uL (ref 0.0–0.7)
Eosinophils Relative: 1.4 % (ref 0.0–5.0)
HCT: 43.2 % (ref 39.0–52.0)
Hemoglobin: 14.8 g/dL (ref 13.0–17.0)
Lymphocytes Relative: 31 % (ref 12.0–46.0)
Lymphs Abs: 1.4 10*3/uL (ref 0.7–4.0)
MCHC: 34.2 g/dL (ref 30.0–36.0)
MCV: 90.8 fl (ref 78.0–100.0)
Monocytes Absolute: 0.5 10*3/uL (ref 0.1–1.0)
Monocytes Relative: 9.8 % (ref 3.0–12.0)
Neutro Abs: 2.7 10*3/uL (ref 1.4–7.7)
Neutrophils Relative %: 57 % (ref 43.0–77.0)
Platelets: 221 10*3/uL (ref 150.0–400.0)
RBC: 4.76 Mil/uL (ref 4.22–5.81)
RDW: 13.4 % (ref 11.5–15.5)
WBC: 4.6 10*3/uL (ref 4.0–10.5)

## 2020-06-24 LAB — COMPREHENSIVE METABOLIC PANEL
ALT: 100 U/L — ABNORMAL HIGH (ref 0–53)
AST: 60 U/L — ABNORMAL HIGH (ref 0–37)
Albumin: 4.7 g/dL (ref 3.5–5.2)
Alkaline Phosphatase: 59 U/L (ref 39–117)
BUN: 13 mg/dL (ref 6–23)
CO2: 28 mEq/L (ref 19–32)
Calcium: 10.4 mg/dL (ref 8.4–10.5)
Chloride: 89 mEq/L — ABNORMAL LOW (ref 96–112)
Creatinine, Ser: 1.17 mg/dL (ref 0.40–1.50)
GFR: 70.9 mL/min (ref >60.00)
Glucose, Bld: 114 mg/dL — ABNORMAL HIGH (ref 70–99)
Potassium: 4.2 mEq/L (ref 3.5–5.1)
Sodium: 129 mEq/L — ABNORMAL LOW (ref 135–145)
Total Bilirubin: 1.6 mg/dL — ABNORMAL HIGH (ref 0.2–1.2)
Total Protein: 7.9 g/dL (ref 6.0–8.3)

## 2020-06-24 LAB — LDL CHOLESTEROL, DIRECT: Direct LDL: 152 mg/dL

## 2020-06-24 LAB — HEMOGLOBIN A1C: Hgb A1c MFr Bld: 6.2 % (ref 4.6–6.5)

## 2020-06-24 LAB — URINALYSIS
Bilirubin Urine: NEGATIVE
Hgb urine dipstick: NEGATIVE
Ketones, ur: NEGATIVE
Leukocytes,Ua: NEGATIVE
Nitrite: NEGATIVE
Specific Gravity, Urine: <=1.005 — AB (ref 1.000–1.030)
Total Protein, Urine: NEGATIVE
Urine Glucose: NEGATIVE
Urobilinogen, UA: 0.2 (ref 0.0–1.0)
pH: 5.5 (ref 5.0–8.0)

## 2020-06-24 LAB — LIPID PANEL
Cholesterol: 257 mg/dL — ABNORMAL HIGH (ref 0–200)
HDL: 55.9 mg/dL (ref >39.00)
Total CHOL/HDL Ratio: 5
Triglycerides: 406 mg/dL — ABNORMAL HIGH (ref 0.0–149.0)

## 2020-06-24 LAB — PSA: PSA: 28.79 ng/mL — ABNORMAL HIGH (ref 0.10–4.00)

## 2020-06-24 LAB — TSH: TSH: 2.02 u[IU]/mL (ref 0.35–4.50)

## 2020-06-24 NOTE — Addendum Note (Signed)
Addended by: Colin Benton on: 06/24/2020 09:58 AM   Modules accepted: Orders

## 2020-06-25 ENCOUNTER — Other Ambulatory Visit: Payer: Self-pay

## 2020-06-25 ENCOUNTER — Ambulatory Visit: Payer: 59 | Admitting: Internal Medicine

## 2020-06-25 ENCOUNTER — Encounter: Payer: Self-pay | Admitting: Internal Medicine

## 2020-06-25 VITALS — BP 152/100 | HR 92 | Temp 98.5°F | Ht 67.0 in | Wt 192.4 lb

## 2020-06-25 DIAGNOSIS — E559 Vitamin D deficiency, unspecified: Secondary | ICD-10-CM | POA: Diagnosis not present

## 2020-06-25 DIAGNOSIS — R7989 Other specified abnormal findings of blood chemistry: Secondary | ICD-10-CM

## 2020-06-25 DIAGNOSIS — I10 Essential (primary) hypertension: Secondary | ICD-10-CM

## 2020-06-25 DIAGNOSIS — R972 Elevated prostate specific antigen [PSA]: Secondary | ICD-10-CM

## 2020-06-25 DIAGNOSIS — F439 Reaction to severe stress, unspecified: Secondary | ICD-10-CM

## 2020-06-25 DIAGNOSIS — E785 Hyperlipidemia, unspecified: Secondary | ICD-10-CM | POA: Diagnosis not present

## 2020-06-25 LAB — PTH, INTACT AND CALCIUM
Calcium: 10.5 mg/dL — ABNORMAL HIGH (ref 8.6–10.3)
PTH: 30 pg/mL (ref 16–77)

## 2020-06-25 MED ORDER — ICOSAPENT ETHYL 1 G PO CAPS: 2.0000 g | ORAL_CAPSULE | Freq: Two times a day (BID) | ORAL | 3 refills | Status: DC

## 2020-06-25 MED ORDER — CIPROFLOXACIN HCL 500 MG PO TABS: 500.0000 mg | ORAL_TABLET | Freq: Two times a day (BID) | ORAL | 0 refills | Status: AC

## 2020-06-25 NOTE — Progress Notes (Signed)
Subjective:  Patient ID: Joseph Livingston, male    DOB: 26-Sep-1965  Age: 55 y.o. MRN: [id number]  CC: Follow-up (6 month f/u)   HPI Joseph Livingston presents for a 6 months f/u on elevated PSA, abn LFTs, dyslipidemia C/o stress - working >> 60 h per week  Outpatient Medications Prior to Visit  Medication Sig Dispense Refill  . Aliskiren-hydroCHLOROthiazide (TEKTURNA HCT) 300-12.5 MG TABS Take 1 tablet by mouth daily. 90 tablet 3  . allopurinol (ZYLOPRIM) 100 MG tablet Take 1 tablet (100 mg total) by mouth daily. 30 tablet 6  . AMBULATORY NON FORMULARY MEDICATION Medication Name: whey powder    . AMBULATORY NON FORMULARY MEDICATION Medication Name: tumeric    . amLODipine (NORVASC) 2.5 MG tablet Take 1 tablet (2.5 mg total) by mouth daily. 30 tablet 11  . aspirin 81 MG tablet Take 81 mg by mouth daily.    . Cholecalciferol (VITAMIN D3) 2000 units capsule Take 1 capsule (2,000 Units total) by mouth daily. 100 capsule 3  . ezetimibe (ZETIA) 10 MG tablet Take 1 tablet (10 mg total) by mouth daily. 90 tablet 3  . MELATONIN PO Take by mouth at bedtime.    Marland Kitchen omega-3 acid ethyl esters (LOVAZA) 1 g capsule Take 2 g by mouth 2 (two) times daily.    . pravastatin (PRAVACHOL) 40 MG tablet Take 1 tablet (40 mg total) by mouth daily. 90 tablet 3  . VIAGRA 100 MG tablet Take 1 tablet (100 mg total) by mouth as needed for erectile dysfunction. 10 tablet 11  . allopurinol (ZYLOPRIM) 100 MG tablet Take 1 tablet (100 mg total) by mouth daily. 90 tablet 3  . diclofenac (VOLTAREN) 75 MG EC tablet Take 1 tablet (75 mg total) by mouth 2 (two) times daily. (Patient not taking: Reported on 06/25/2020) 50 tablet 2  . valACYclovir (VALTREX) 500 MG tablet Take 1 tablet (500 mg total) by mouth 2 (two) times daily. (Patient not taking: Reported on 06/25/2020) 14 tablet 0  . triamcinolone acetonide (KENALOG) 10 MG/ML injection 10 mg      No facility-administered medications prior to visit.    ROS: Review  of Systems  Constitutional: Positive for fatigue and unexpected weight change. Negative for appetite change.  HENT: Negative for congestion, nosebleeds, sneezing, sore throat and trouble swallowing.   Eyes: Negative for itching and visual disturbance.  Respiratory: Negative for cough.   Cardiovascular: Negative for chest pain, palpitations and leg swelling.  Gastrointestinal: Negative for abdominal distention, blood in stool, diarrhea and nausea.  Genitourinary: Negative for frequency and hematuria.  Musculoskeletal: Negative for back pain, gait problem, joint swelling and neck pain.  Skin: Negative for rash.  Neurological: Negative for dizziness, tremors, speech difficulty and weakness.  Psychiatric/Behavioral: Positive for decreased concentration. Negative for agitation, dysphoric mood, sleep disturbance and suicidal ideas. The patient is nervous/anxious.     Objective:  BP (!) 152/100 (BP Location: Left Arm)   Pulse 92   Temp 98.5 F (36.9 C) (Oral)   Ht 5\' 7"  (1.702 m)   Wt 192 lb 6.4 oz (87.3 kg)   SpO2 98%   BMI 30.13 kg/m   BP Readings from Last 3 Encounters:  06/25/20 (!) 152/100  11/28/19 (!) 144/88  07/23/19 (!) 162/98    Wt Readings from Last 3 Encounters:  06/25/20 192 lb 6.4 oz (87.3 kg)  11/28/19 188 lb (85.3 kg)  07/23/19 199 lb (90.3 kg)    Physical Exam Constitutional:  General: He is not in acute distress.    Appearance: He is well-developed.     Comments: NAD  Eyes:     Conjunctiva/sclera: Conjunctivae normal.     Pupils: Pupils are equal, round, and reactive to light.  Neck:     Thyroid: No thyromegaly.     Vascular: No JVD.  Cardiovascular:     Rate and Rhythm: Normal rate and regular rhythm.     Heart sounds: Normal heart sounds. No murmur heard. No friction rub. No gallop.   Pulmonary:     Effort: Pulmonary effort is normal. No respiratory distress.     Breath sounds: Normal breath sounds. No wheezing or rales.  Chest:     Chest wall:  No tenderness.  Abdominal:     General: Bowel sounds are normal. There is no distension.     Palpations: Abdomen is soft. There is no mass.     Tenderness: There is no abdominal tenderness. There is no guarding or rebound.  Musculoskeletal:        General: No tenderness. Normal range of motion.     Cervical back: Normal range of motion.  Lymphadenopathy:     Cervical: No cervical adenopathy.  Skin:    General: Skin is warm and dry.     Findings: No rash.  Neurological:     Mental Status: He is alert and oriented to person, place, and time.     Cranial Nerves: No cranial nerve deficit.     Motor: No abnormal muscle tone.     Coordination: Coordination normal.     Gait: Gait normal.     Deep Tendon Reflexes: Reflexes are normal and symmetric.  Psychiatric:        Behavior: Behavior normal.        Thought Content: Thought content normal.        Judgment: Judgment normal.     Lab Results  Component Value Date   WBC 4.6 06/24/2020   HGB 14.8 06/24/2020   HCT 43.2 06/24/2020   PLT 221.0 06/24/2020   GLUCOSE 114 (H) 06/24/2020   CHOL 257 (H) 06/24/2020   TRIG (H) 06/24/2020    406.0 Triglyceride is over 400; calculations on Lipids are invalid.   HDL 55.90 06/24/2020   LDLDIRECT 152.0 06/24/2020   ALT 100 (H) 06/24/2020   AST 60 (H) 06/24/2020   NA 129 (L) 06/24/2020   K 4.2 06/24/2020   CL 89 (L) 06/24/2020   CREATININE 1.17 06/24/2020   BUN 13 06/24/2020   CO2 28 06/24/2020   TSH 2.02 06/24/2020   PSA 28.79 (H) 06/24/2020   HGBA1C 6.2 06/24/2020    MR PROSTATE W WO CONTRAST  Result Date: 01/01/2019 CLINICAL DATA:  Elevated PSA. 20.8 most recent. Prior negative biopsy. EXAM: MR PROSTATE WITHOUT AND WITH CONTRAST TECHNIQUE: Multiplanar multisequence MRI images were obtained of the pelvis centered about the prostate. Pre and post contrast images were obtained. CONTRAST:  30mL MULTIHANCE GADOBENATE DIMEGLUMINE 529 MG/ML IV SOLN COMPARISON:  07/25/2017 FINDINGS: Prostate:  Demonstrates relatively mild central gland enlargement and heterogeneity, consistent with benign prostatic hyperplasia. Right mid gland well-circumscribed central gland nodule of 1.9 cm is similar to on the prior exam and has benign characteristics ( PI-RADS(v2.1)-) 2.). Felt to be similar to on the prior exam. No areas of restricted diffusion. Relatively diffuse early post-contrast enhancement throughout the peripheral zone, including on series 26. Volume: 4.3 x 3.3 x 3.9 cm (volume = 29 cm^3) Transcapsular spread:  Absent Seminal vesicle  involvement: Absent Neurovascular bundle involvement: Absent Pelvic adenopathy: Absent Bone metastasis: Absent Other findings: No significant free fluid. Normal urinary bladder. Small fat containing right inguinal hernia. Trace bilateral scrotal fluid is likely physiologic. IMPRESSION: 1. No findings to suggest high-grade or macroscopic prostate carcinoma. 2. Diffuse T2 hypointensity and early post-contrast enhancement throughout the peripheral zone, relatively similar and suspicious for prostatitis. Electronically Signed   By: Abigail Miyamoto M.D.   On: 01/01/2019 13:45    Assessment & Plan:    Walker Kehr, MD

## 2020-06-25 NOTE — Assessment & Plan Note (Signed)
On Vit D 

## 2020-06-25 NOTE — Assessment & Plan Note (Signed)
Empiric abx PSA, free PSA 3 mo

## 2020-06-25 NOTE — Assessment & Plan Note (Signed)
Added Vascepa

## 2020-06-25 NOTE — Assessment & Plan Note (Signed)
CT calc score is 62 in 2020

## 2020-06-25 NOTE — Assessment & Plan Note (Signed)
Treat dyslipidemia

## 2020-06-30 NOTE — Telephone Encounter (Signed)
Patient wondering if he should be going to the gym or not, patient been reading up on medication and is concerned about it.

## 2020-07-01 ENCOUNTER — Other Ambulatory Visit: Payer: Self-pay | Admitting: Internal Medicine

## 2020-07-02 NOTE — Telephone Encounter (Signed)
   Please return call to patient Patient confirmed that 60 hours per week is the request

## 2020-07-06 DIAGNOSIS — Z0279 Encounter for issue of other medical certificate: Secondary | ICD-10-CM

## 2020-07-06 NOTE — Telephone Encounter (Signed)
Forms have been signed, Faxed to [employer] [phone], Copy sent to scan &Charged for.   Patient informed and original mailed to patient for his records.

## 2020-07-06 NOTE — Telephone Encounter (Signed)
Forms have been completed and Placed in your box to review and sign.

## 2020-07-07 DIAGNOSIS — F439 Reaction to severe stress, unspecified: Secondary | ICD-10-CM | POA: Insufficient documentation

## 2020-07-07 NOTE — Assessment & Plan Note (Signed)
Discussed FMLA - limit work to<60 h/wk

## 2020-08-08 ENCOUNTER — Other Ambulatory Visit: Payer: Self-pay | Admitting: Internal Medicine

## 2020-08-27 ENCOUNTER — Ambulatory Visit: Payer: 59 | Admitting: Internal Medicine

## 2020-09-03 ENCOUNTER — Ambulatory Visit: Payer: 59 | Admitting: Internal Medicine

## 2020-09-17 ENCOUNTER — Ambulatory Visit: Payer: 59 | Admitting: Internal Medicine

## 2020-09-24 ENCOUNTER — Ambulatory Visit: Payer: 59 | Admitting: Internal Medicine

## 2020-10-01 ENCOUNTER — Ambulatory Visit: Payer: 59 | Admitting: Internal Medicine

## 2020-10-01 ENCOUNTER — Encounter: Payer: Self-pay | Admitting: Internal Medicine

## 2020-10-01 ENCOUNTER — Other Ambulatory Visit: Payer: Self-pay

## 2020-10-01 DIAGNOSIS — R7989 Other specified abnormal findings of blood chemistry: Secondary | ICD-10-CM

## 2020-10-01 DIAGNOSIS — E785 Hyperlipidemia, unspecified: Secondary | ICD-10-CM | POA: Diagnosis not present

## 2020-10-01 DIAGNOSIS — R5382 Chronic fatigue, unspecified: Secondary | ICD-10-CM

## 2020-10-01 DIAGNOSIS — R972 Elevated prostate specific antigen [PSA]: Secondary | ICD-10-CM

## 2020-10-01 DIAGNOSIS — F439 Reaction to severe stress, unspecified: Secondary | ICD-10-CM

## 2020-10-01 DIAGNOSIS — I10 Essential (primary) hypertension: Secondary | ICD-10-CM

## 2020-10-01 NOTE — Assessment & Plan Note (Signed)
CT calc score is 62 in

## 2020-10-01 NOTE — Assessment & Plan Note (Addendum)
Worse.  Needs to be off work to rest 2 d/week.  Obtain labs including testosterone

## 2020-10-01 NOTE — Progress Notes (Signed)
Subjective:  Patient ID: Joseph Livingston, male    DOB: [birth year]  Age: 55 y.o. MRN: [id number]  CC: Follow-up (2 month f/u)   HPI Joseph Livingston presents for fatigue, HTN, dyslipidemia f/u C/o extreem fatigue and sleepiness due to being overworked: He has been now working 7 days a week  Outpatient Medications Prior to Visit  Medication Sig Dispense Refill   allopurinol (ZYLOPRIM) 100 MG tablet Take 1 tablet (100 mg total) by mouth daily. 30 tablet 6   AMBULATORY NON FORMULARY MEDICATION Medication Name: whey powder     AMBULATORY NON FORMULARY MEDICATION Medication Name: tumeric     amLODipine (NORVASC) 2.5 MG tablet Take 1 tablet (2.5 mg total) by mouth daily. 30 tablet 11   aspirin 81 MG tablet Take 81 mg by mouth daily.     Cholecalciferol (VITAMIN D3) 2000 units capsule Take 1 capsule (2,000 Units total) by mouth daily. 100 capsule 3   ezetimibe (ZETIA) 10 MG tablet TAKE 1 TABLET BY MOUTH EVERY DAY 90 tablet 3   icosapent Ethyl (VASCEPA) 1 g capsule Take 2 capsules (2 g total) by mouth 2 (two) times daily. 360 capsule 3   MELATONIN PO Take by mouth at bedtime.     pravastatin (PRAVACHOL) 20 MG tablet TAKE 1 TABLET BY MOUTH EVERY DAY 90 tablet 3   pravastatin (PRAVACHOL) 40 MG tablet Take 1 tablet (40 mg total) by mouth daily. 90 tablet 3   sildenafil (VIAGRA) 100 MG tablet TAKE 1 TABLET (100 MG TOTAL) BY MOUTH AS NEEDED. 12 tablet 5   TEKTURNA HCT 300-12.5 MG TABS TAKE 1 TABLET BY MOUTH EVERY DAY 90 tablet 3   No facility-administered medications prior to visit.    ROS: Review of Systems  Constitutional:  Positive for fatigue. Negative for appetite change and unexpected weight change.  HENT:  Negative for congestion, nosebleeds, sneezing, sore throat and trouble swallowing.   Eyes:  Negative for itching and visual disturbance.  Respiratory:  Negative for cough.   Cardiovascular:  Negative for chest pain, palpitations and leg swelling.  Gastrointestinal:  Negative  for abdominal distention, blood in stool, diarrhea and nausea.  Genitourinary:  Negative for frequency and hematuria.  Musculoskeletal:  Negative for back pain, gait problem, joint swelling and neck pain.  Skin:  Negative for rash.  Neurological:  Negative for dizziness, tremors, speech difficulty and weakness.  Psychiatric/Behavioral:  Negative for agitation, dysphoric mood and sleep disturbance. The patient is not nervous/anxious.    Objective:  BP (!) 152/90 (BP Location: Left Arm)   Pulse 90   Temp 98.5 F (36.9 C) (Oral)   Ht '5\' 7"'$  (1.702 m)   Wt 199 lb 9.6 oz (90.5 kg)   SpO2 97%   BMI 31.26 kg/m   BP Readings from Last 3 Encounters:  10/01/20 (!) 152/90  06/25/20 (!) 152/100  11/28/19 (!) 144/88    Wt Readings from Last 3 Encounters:  10/01/20 199 lb 9.6 oz (90.5 kg)  06/25/20 192 lb 6.4 oz (87.3 kg)  11/28/19 188 lb (85.3 kg)    Physical Exam Constitutional:      General: He is not in acute distress.    Appearance: He is well-developed.     Comments: NAD  Eyes:     Conjunctiva/sclera: Conjunctivae normal.     Pupils: Pupils are equal, round, and reactive to light.  Neck:     Thyroid: No thyromegaly.     Vascular: No JVD.  Cardiovascular:  Rate and Rhythm: Normal rate and regular rhythm.     Heart sounds: Normal heart sounds. No murmur heard.   No friction rub. No gallop.  Pulmonary:     Effort: Pulmonary effort is normal. No respiratory distress.     Breath sounds: Normal breath sounds. No wheezing or rales.  Chest:     Chest wall: No tenderness.  Abdominal:     General: Bowel sounds are normal. There is no distension.     Palpations: Abdomen is soft. There is no mass.     Tenderness: There is no abdominal tenderness. There is no guarding or rebound.  Musculoskeletal:        General: No tenderness. Normal range of motion.     Cervical back: Normal range of motion.  Lymphadenopathy:     Cervical: No cervical adenopathy.  Skin:    General: Skin is  warm and dry.     Findings: No rash.  Neurological:     Mental Status: He is alert and oriented to person, place, and time.     Cranial Nerves: No cranial nerve deficit.     Motor: No abnormal muscle tone.     Coordination: Coordination normal.     Gait: Gait normal.     Deep Tendon Reflexes: Reflexes are normal and symmetric.  Psychiatric:        Behavior: Behavior normal.        Thought Content: Thought content normal.        Judgment: Judgment normal.    Lab Results  Component Value Date   WBC 4.6 06/24/2020   HGB 14.8 06/24/2020   HCT 43.2 06/24/2020   PLT 221.0 06/24/2020   GLUCOSE 114 (H) 06/24/2020   CHOL 257 (H) 06/24/2020   TRIG (H) 06/24/2020    406.0 Triglyceride is over 400; calculations on Lipids are invalid.   HDL 55.90 06/24/2020   LDLDIRECT 152.0 06/24/2020   ALT 100 (H) 06/24/2020   AST 60 (H) 06/24/2020   NA 129 (L) 06/24/2020   K 4.2 06/24/2020   CL 89 (L) 06/24/2020   CREATININE 1.17 06/24/2020   BUN 13 06/24/2020   CO2 28 06/24/2020   TSH 2.02 06/24/2020   PSA 28.79 (H) 06/24/2020   HGBA1C 6.2 06/24/2020    MR PROSTATE W WO CONTRAST  Result Date: 01/01/2019 CLINICAL DATA:  Elevated PSA. 20.8 most recent. Prior negative biopsy. EXAM: MR PROSTATE WITHOUT AND WITH CONTRAST TECHNIQUE: Multiplanar multisequence MRI images were obtained of the pelvis centered about the prostate. Pre and post contrast images were obtained. CONTRAST:  32m MULTIHANCE GADOBENATE DIMEGLUMINE 529 MG/ML IV SOLN COMPARISON:  07/25/2017 FINDINGS: Prostate: Demonstrates relatively mild central gland enlargement and heterogeneity, consistent with benign prostatic hyperplasia. Right mid gland well-circumscribed central gland nodule of 1.9 cm is similar to on the prior exam and has benign characteristics ( PI-RADS(v2.1)-) 2.). Felt to be similar to on the prior exam. No areas of restricted diffusion. Relatively diffuse early post-contrast enhancement throughout the peripheral zone,  including on series 26. Volume: 4.3 x 3.3 x 3.9 cm (volume = 29 cm^3) Transcapsular spread:  Absent Seminal vesicle involvement: Absent Neurovascular bundle involvement: Absent Pelvic adenopathy: Absent Bone metastasis: Absent Other findings: No significant free fluid. Normal urinary bladder. Small fat containing right inguinal hernia. Trace bilateral scrotal fluid is likely physiologic. IMPRESSION: 1. No findings to suggest high-grade or macroscopic prostate carcinoma. 2. Diffuse T2 hypointensity and early post-contrast enhancement throughout the peripheral zone, relatively similar and suspicious for prostatitis. Electronically  Signed   By: Abigail Miyamoto M.D.   On: 01/01/2019 13:45    Assessment & Plan:     Walker Kehr, MD

## 2020-10-02 LAB — COMPREHENSIVE METABOLIC PANEL
ALT: 131 U/L — ABNORMAL HIGH (ref 0–53)
AST: 74 U/L — ABNORMAL HIGH (ref 0–37)
Albumin: 5.1 g/dL (ref 3.5–5.2)
Alkaline Phosphatase: 50 U/L (ref 39–117)
BUN: 17 mg/dL (ref 6–23)
CO2: 28 mEq/L (ref 19–32)
Calcium: 10.5 mg/dL (ref 8.4–10.5)
Chloride: 95 mEq/L — ABNORMAL LOW (ref 96–112)
Creatinine, Ser: 1.19 mg/dL (ref 0.40–1.50)
GFR: 69.34 mL/min (ref >60.00)
Glucose, Bld: 96 mg/dL (ref 70–99)
Potassium: 4.2 mEq/L (ref 3.5–5.1)
Sodium: 133 mEq/L — ABNORMAL LOW (ref 135–145)
Total Bilirubin: 1.3 mg/dL — ABNORMAL HIGH (ref 0.2–1.2)
Total Protein: 8.5 g/dL — ABNORMAL HIGH (ref 6.0–8.3)

## 2020-10-02 LAB — LIPID PANEL
Cholesterol: 263 mg/dL — ABNORMAL HIGH (ref 0–200)
HDL: 60.5 mg/dL (ref >39.00)
NonHDL: 202.94
Total CHOL/HDL Ratio: 4
Triglycerides: 218 mg/dL — ABNORMAL HIGH (ref 0.0–149.0)
VLDL: 43.6 mg/dL — ABNORMAL HIGH (ref 0.0–40.0)

## 2020-10-02 LAB — LDL CHOLESTEROL, DIRECT: Direct LDL: 161 mg/dL

## 2020-10-02 LAB — PSA, TOTAL AND FREE
PSA, Free: 1.3 ng/mL
PSA, Total: 28.1 ng/mL — ABNORMAL HIGH (ref <=4.0)

## 2020-10-03 ENCOUNTER — Other Ambulatory Visit: Payer: Self-pay | Admitting: Internal Medicine

## 2020-10-03 DIAGNOSIS — R5382 Chronic fatigue, unspecified: Secondary | ICD-10-CM

## 2020-10-03 DIAGNOSIS — E785 Hyperlipidemia, unspecified: Secondary | ICD-10-CM

## 2020-10-03 DIAGNOSIS — R7989 Other specified abnormal findings of blood chemistry: Secondary | ICD-10-CM

## 2020-10-04 NOTE — Assessment & Plan Note (Signed)
Not well controlled.  Cont Tekturna HCT, amlodipine.  No added salt diet. H will need to take more time off

## 2020-10-04 NOTE — Assessment & Plan Note (Signed)
Discussed.  Stress and fatigue is worse.  Would like to obtain another set of liver tests, testosterone, TSH, free T4.  Call[place]han needs to cut back on work due to medical reasons.

## 2020-10-05 NOTE — Telephone Encounter (Signed)
Per last lab report testosterone level was not checked.Marland Kitchenlmb

## 2020-10-16 ENCOUNTER — Other Ambulatory Visit: Payer: Self-pay | Admitting: Internal Medicine

## 2020-10-16 DIAGNOSIS — R972 Elevated prostate specific antigen [PSA]: Secondary | ICD-10-CM

## 2020-11-18 ENCOUNTER — Ambulatory Visit: Payer: 59 | Admitting: Internal Medicine

## 2020-12-16 ENCOUNTER — Other Ambulatory Visit: Payer: Self-pay | Admitting: Internal Medicine

## 2020-12-31 ENCOUNTER — Telehealth: Payer: Self-pay | Admitting: Internal Medicine

## 2020-12-31 NOTE — Telephone Encounter (Signed)
Done. Thx.

## 2020-12-31 NOTE — Telephone Encounter (Signed)
Place on MD desk in orange folder for completion.Marland KitchenJohny Livingston

## 2020-12-31 NOTE — Telephone Encounter (Signed)
Type of form received- FMLA   Form placed in- Provider mailbox   Additional instructions from the patient- Fax when completed    Reminded patient that forms take 7-10 business days

## 2021-01-01 NOTE — Telephone Encounter (Signed)
Noted.. original was given to patient.Joseph KitchenJohny Chess

## 2021-01-01 NOTE — Telephone Encounter (Signed)
Patient paid $29 fee, paperwork was faxed to number listed on the form

## 2021-01-01 NOTE — Telephone Encounter (Signed)
Notified pt MD has completed FMLA forms. There is a fee of $29 before form can be fax to employer. Pt states he will come today to pay for fee.Marland KitchenJohny Livingston

## 2021-03-23 NOTE — Telephone Encounter (Signed)
Called pt he states would like to speak directly w/ Dr. Tessa Lerner pertaining to his prostate. He states he saw his urologist bt would not go into detail.Marland KitchenJohny Livingston

## 2021-03-29 NOTE — Telephone Encounter (Signed)
Pt requesting cal from MD concerning his prostate level. He states MD referred him to a urologist and he saw provider. Would like MD call him about prostates not stress level at work...Joseph Livingston

## 2021-04-01 ENCOUNTER — Telehealth (INDEPENDENT_AMBULATORY_CARE_PROVIDER_SITE_OTHER): Payer: 59 | Admitting: Internal Medicine

## 2021-04-01 ENCOUNTER — Other Ambulatory Visit: Payer: Self-pay

## 2021-04-01 ENCOUNTER — Encounter: Payer: Self-pay | Admitting: Internal Medicine

## 2021-04-01 DIAGNOSIS — C61 Malignant neoplasm of prostate: Secondary | ICD-10-CM | POA: Insufficient documentation

## 2021-04-01 DIAGNOSIS — F439 Reaction to severe stress, unspecified: Secondary | ICD-10-CM | POA: Diagnosis not present

## 2021-04-01 DIAGNOSIS — F418 Other specified anxiety disorders: Secondary | ICD-10-CM | POA: Diagnosis not present

## 2021-04-01 MED ORDER — LORAZEPAM 1 MG PO TABS: 0.5000 mg | ORAL_TABLET | Freq: Two times a day (BID) | ORAL | 1 refills | Status: AC | PRN

## 2021-04-01 NOTE — Assessment & Plan Note (Signed)
New dx of prostate cancer by Dr Tresa Moore (Gleason 7) this Jan 2023 Planning to have CT and a bone scan Discussed

## 2021-04-01 NOTE — Progress Notes (Signed)
Virtual Visit via Video Note  I connected with Joseph Livingston on 04/01/21 at  9:10 AM EST by a video enabled telemedicine application and verified that I am speaking with the correct person using two identifiers.   I discussed the limitations of evaluation and management by telemedicine and the availability of in person appointments. The patient expressed understanding and agreed to proceed.  I was located at our [facility] office. The patient was at home. There was no one else present in the visit.  No chief complaint on file.    History of Present Illness: C/o a new dx of prostate cancer by Dr Tresa Moore (Gleason 7) this Jan. Planning to have CT and a bone scan C/o stress  Review of Systems  Constitutional:  Positive for fever. Negative for weight loss.  HENT:  Negative for congestion.   Gastrointestinal:  Negative for abdominal pain, constipation, diarrhea and nausea.  Musculoskeletal:  Negative for back pain, joint pain and myalgias.  Neurological:  Negative for dizziness and focal weakness.  Psychiatric/Behavioral:  Negative for depression, memory loss and suicidal ideas. The patient is nervous/anxious.     Observations/Objective: The patient appears to be in no acute distress  Assessment and Plan:  Problem List Items Addressed This Visit     Anxiety about health    C/o a new dx of prostate cancer by Dr Tresa Moore (Gleason 7) this Jan 2023 Lorazepam Rx low dose prn  Potential benefits of a short/long term benzodiazepines  use as well as potential risks  and complications were explained to the patient and were aknowledged. Discussed      Relevant Medications   LORazepam (ATIVAN) 1 MG tablet   Prostate CA ([place])    New dx of prostate cancer by Dr Tresa Moore (Gleason 7) this Jan 2023 Planning to have CT and a bone scan Discussed      Relevant Medications   LORazepam (ATIVAN) 1 MG tablet   Stress    FMLA - limit work to<60 h/wk        Meds ordered this encounter   Medications   LORazepam (ATIVAN) 1 MG tablet    Sig: Take 0.5-1 tablets (0.5-1 mg total) by mouth 2 (two) times daily as needed for anxiety.    Dispense:  60 tablet    Refill:  1     Follow Up Instructions:    I discussed the assessment and treatment plan with the patient. The patient was provided an opportunity to ask questions and all were answered. The patient agreed with the plan and demonstrated an understanding of the instructions.   The patient was advised to call back or seek an in-person evaluation if the symptoms worsen or if the condition fails to improve as anticipated.  I provided face-to-face time during this encounter. We were at different locations.   Walker Kehr, MD

## 2021-04-01 NOTE — Assessment & Plan Note (Signed)
FMLA - limit work to<60 h/wk

## 2021-04-01 NOTE — Assessment & Plan Note (Signed)
C/o a new dx of prostate cancer by Dr Tresa Moore (Gleason 7) this Jan 2023 Lorazepam Rx low dose prn  Potential benefits of a short/long term benzodiazepines  use as well as potential risks  and complications were explained to the patient and were aknowledged. Discussed

## 2021-04-13 ENCOUNTER — Other Ambulatory Visit (HOSPITAL_COMMUNITY): Payer: Self-pay | Admitting: Urology

## 2021-04-13 DIAGNOSIS — C61 Malignant neoplasm of prostate: Secondary | ICD-10-CM

## 2021-04-16 ENCOUNTER — Other Ambulatory Visit: Payer: Self-pay

## 2021-04-16 MED ORDER — SILDENAFIL CITRATE 100 MG PO TABS: ORAL_TABLET | ORAL | 5 refills | Status: DC

## 2021-04-20 ENCOUNTER — Encounter (HOSPITAL_COMMUNITY): Payer: Self-pay

## 2021-04-20 ENCOUNTER — Ambulatory Visit (HOSPITAL_COMMUNITY): Payer: 59

## 2021-04-26 ENCOUNTER — Other Ambulatory Visit (HOSPITAL_COMMUNITY): Payer: 59

## 2021-06-22 ENCOUNTER — Encounter: Payer: Self-pay | Admitting: Internal Medicine

## 2021-06-24 ENCOUNTER — Telehealth: Payer: Self-pay | Admitting: Internal Medicine

## 2021-06-24 NOTE — Telephone Encounter (Signed)
Patient left FMLA papers - please let patient know when they are ready ?

## 2021-06-25 NOTE — Telephone Encounter (Signed)
MD completed form notified pt ready for pick-up. And fax to [phone].Marland KitchenJohny Chess ?

## 2021-06-25 NOTE — Telephone Encounter (Signed)
Place FMLA form on MD desk.. in purple folder.Marland KitchenJohny Chess ?

## 2021-06-29 NOTE — Telephone Encounter (Signed)
Pt states amount of days off on pg 3 of fmla form was not completed ? ?Pt requesting section completed and re-faxed ?

## 2021-06-29 NOTE — Telephone Encounter (Signed)
FMLA has not been downloaded into chart yet.. sent pt mychart message to see if he can fax FMLA back.Marland KitchenJohny Livingston ?

## 2021-07-08 NOTE — Telephone Encounter (Signed)
Downloaded FMLA answer question 9 and faxed back to the nurse.Marland KitchenJohny Chess ?

## 2021-08-10 ENCOUNTER — Other Ambulatory Visit: Payer: Self-pay | Admitting: Internal Medicine

## 2021-08-12 ENCOUNTER — Other Ambulatory Visit: Payer: Self-pay | Admitting: Internal Medicine

## 2021-08-16 ENCOUNTER — Encounter: Payer: Self-pay | Admitting: Internal Medicine

## 2021-08-18 ENCOUNTER — Other Ambulatory Visit: Payer: Self-pay | Admitting: Internal Medicine

## 2021-08-18 MED ORDER — TRIAMTERENE-HCTZ 37.5-25 MG PO TABS: 1.0000 | ORAL_TABLET | Freq: Every day | ORAL | 11 refills | Status: DC

## 2021-08-18 NOTE — Progress Notes (Signed)
Legrand Como, I emailed a new prescription for triamterene-HCTZ to your pharmacy.  Take 1 a day.  Continue with amlodipine 2.5 mg daily.  Please see me in 2-4 weeks.  Check your blood pressure and keep a record if possible.  Thanks

## 2021-09-28 ENCOUNTER — Other Ambulatory Visit: Payer: Self-pay | Admitting: Internal Medicine

## 2021-09-30 ENCOUNTER — Other Ambulatory Visit: Payer: Self-pay

## 2021-09-30 MED ORDER — AMLODIPINE BESYLATE 2.5 MG PO TABS: 2.5000 mg | ORAL_TABLET | Freq: Every day | ORAL | 0 refills | Status: DC

## 2021-10-14 ENCOUNTER — Encounter: Payer: Self-pay | Admitting: Internal Medicine

## 2021-10-14 ENCOUNTER — Ambulatory Visit: Payer: 59 | Admitting: Internal Medicine

## 2021-10-14 DIAGNOSIS — E559 Vitamin D deficiency, unspecified: Secondary | ICD-10-CM

## 2021-10-14 DIAGNOSIS — R972 Elevated prostate specific antigen [PSA]: Secondary | ICD-10-CM

## 2021-10-14 DIAGNOSIS — C61 Malignant neoplasm of prostate: Secondary | ICD-10-CM

## 2021-10-14 DIAGNOSIS — F418 Other specified anxiety disorders: Secondary | ICD-10-CM

## 2021-10-14 DIAGNOSIS — I10 Essential (primary) hypertension: Secondary | ICD-10-CM

## 2021-10-14 NOTE — Assessment & Plan Note (Addendum)
Sch f/u w/Dr Luther Hearing at 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   Electronically Reviewed by: Oren Beckmann, MD, [facility]  Electronically Reviewed on: 06/03/2021 9:29 AM   Bx at [place] in 08/2021 was (-)  Bx in [place] in 1/23: Prostatic adenocarcinoma, Gleason grade 3 + 3 = 6/10 (Grade Group 1).

## 2021-10-14 NOTE — Progress Notes (Signed)
Subjective:  Patient ID: Joseph Livingston, male    DOB: 1965-07-02  Age: 56 y.o. MRN: [id number]  CC: No chief complaint on file.   HPI Joseph Livingston presents for elevated LFTs, stress, anxiety, HTN: BP ok at home.   Outpatient Medications Prior to Visit  Medication Sig Dispense Refill   allopurinol (ZYLOPRIM) 100 MG tablet Take 1 tablet (100 mg total) by mouth daily. 30 tablet 6   AMBULATORY NON FORMULARY MEDICATION Medication Name: whey powder     AMBULATORY NON FORMULARY MEDICATION Medication Name: tumeric     amLODipine (NORVASC) 2.5 MG tablet Take 1 tablet (2.5 mg total) by mouth daily. Annual appt is overdue w/labs must see provider for future refills 30 tablet 0   aspirin 81 MG tablet Take 81 mg by mouth daily.     Cholecalciferol (VITAMIN D3) 2000 units capsule Take 1 capsule (2,000 Units total) by mouth daily. 100 capsule 3   ezetimibe (ZETIA) 10 MG tablet Take 1 tablet (10 mg total) by mouth daily. Annual appt due in July w/labs must see provider for future refills 90 tablet 0   icosapent Ethyl (VASCEPA) 1 g capsule TAKE 2 CAPSULES BY MOUTH TWICE A DAY 360 capsule 0   LORazepam (ATIVAN) 1 MG tablet Take 0.5-1 tablets (0.5-1 mg total) by mouth 2 (two) times daily as needed for anxiety. 60 tablet 1   MELATONIN PO Take by mouth at bedtime.     pravastatin (PRAVACHOL) 20 MG tablet Take 1 tablet (20 mg total) by mouth daily. Annual appt due in July w/labs must see provider for future refills 90 tablet 0   sildenafil (VIAGRA) 100 MG tablet TAKE 1 TABLET BY MOUTH EVERY DAY AS NEEDED FOR ERECTILE DYSFUNCTION 10 tablet 5   triamterene-hydrochlorothiazide (MAXZIDE-25) 37.5-25 MG tablet Take 1 tablet by mouth daily. 30 tablet 11   No facility-administered medications prior to visit.    ROS: Review of Systems  Constitutional:  Positive for fatigue. Negative for appetite change and unexpected weight change.  HENT:  Negative for congestion, nosebleeds, sneezing, sore throat  and trouble swallowing.   Eyes:  Negative for itching and visual disturbance.  Respiratory:  Negative for cough.   Cardiovascular:  Negative for chest pain, palpitations and leg swelling.  Gastrointestinal:  Negative for abdominal distention, blood in stool, diarrhea and nausea.  Genitourinary:  Negative for frequency and hematuria.  Musculoskeletal:  Negative for back pain, gait problem, joint swelling and neck pain.  Skin:  Negative for rash.  Neurological:  Negative for dizziness, tremors, speech difficulty and weakness.  Psychiatric/Behavioral:  Negative for agitation, dysphoric mood, sleep disturbance and suicidal ideas. The patient is nervous/anxious.     Objective:  BP (!) 140/90 (BP Location: Left Arm, Patient Position: Sitting, Cuff Size: Normal)   Pulse 85   Temp 98.6 F (37 C) (Oral)   Ht '5\' 7"'$  (1.702 m)   Wt 195 lb (88.5 kg)   SpO2 97%   BMI 30.54 kg/m   BP Readings from Last 3 Encounters:  10/14/21 (!) 140/90  10/01/20 (!) 152/90  06/25/20 (!) 152/100    Wt Readings from Last 3 Encounters:  10/14/21 195 lb (88.5 kg)  10/01/20 199 lb 9.6 oz (90.5 kg)  06/25/20 192 lb 6.4 oz (87.3 kg)    Physical Exam Constitutional:      General: He is not in acute distress.    Appearance: He is well-developed.     Comments: NAD  Eyes:  Conjunctiva/sclera: Conjunctivae normal.     Pupils: Pupils are equal, round, and reactive to light.  Neck:     Thyroid: No thyromegaly.     Vascular: No JVD.  Cardiovascular:     Rate and Rhythm: Normal rate and regular rhythm.     Heart sounds: Normal heart sounds. No murmur heard.    No friction rub. No gallop.  Pulmonary:     Effort: Pulmonary effort is normal. No respiratory distress.     Breath sounds: Normal breath sounds. No wheezing or rales.  Chest:     Chest wall: No tenderness.  Abdominal:     General: Bowel sounds are normal. There is no distension.     Palpations: Abdomen is soft. There is no mass.     Tenderness:  There is no abdominal tenderness. There is no guarding or rebound.  Musculoskeletal:        General: No tenderness. Normal range of motion.     Cervical back: Normal range of motion.  Lymphadenopathy:     Cervical: No cervical adenopathy.  Skin:    General: Skin is warm and dry.     Findings: No rash.  Neurological:     Mental Status: He is alert and oriented to person, place, and time.     Cranial Nerves: No cranial nerve deficit.     Motor: No abnormal muscle tone.     Coordination: Coordination normal.     Gait: Gait normal.     Deep Tendon Reflexes: Reflexes are normal and symmetric.  Psychiatric:        Behavior: Behavior normal.        Thought Content: Thought content normal.        Judgment: Judgment normal.     Lab Results  Component Value Date   WBC 4.6 06/24/2020   HGB 14.8 06/24/2020   HCT 43.2 06/24/2020   PLT 221.0 06/24/2020   GLUCOSE 96 10/01/2020   CHOL 263 (H) 10/01/2020   TRIG 218.0 (H) 10/01/2020   HDL 60.50 10/01/2020   LDLDIRECT 161.0 10/01/2020   ALT 131 (H) 10/01/2020   AST 74 (H) 10/01/2020   NA 133 (L) 10/01/2020   K 4.2 10/01/2020   CL 95 (L) 10/01/2020   CREATININE 1.19 10/01/2020   BUN 17 10/01/2020   CO2 28 10/01/2020   TSH 2.02 06/24/2020   PSA 28.79 (H) 06/24/2020   HGBA1C 6.2 06/24/2020    MR PROSTATE W WO CONTRAST  Result Date: 01/01/2019 CLINICAL DATA:  Elevated PSA. 20.8 most recent. Prior negative biopsy. EXAM: MR PROSTATE WITHOUT AND WITH CONTRAST TECHNIQUE: Multiplanar multisequence MRI images were obtained of the pelvis centered about the prostate. Pre and post contrast images were obtained. CONTRAST:  85m MULTIHANCE GADOBENATE DIMEGLUMINE 529 MG/ML IV SOLN COMPARISON:  07/25/2017 FINDINGS: Prostate: Demonstrates relatively mild central gland enlargement and heterogeneity, consistent with benign prostatic hyperplasia. Right mid gland well-circumscribed central gland nodule of 1.9 cm is similar to on the prior exam and has  benign characteristics ( PI-RADS(v2.1)-) 2.). Felt to be similar to on the prior exam. No areas of restricted diffusion. Relatively diffuse early post-contrast enhancement throughout the peripheral zone, including on series 26. Volume: 4.3 x 3.3 x 3.9 cm (volume = 29 cm^3) Transcapsular spread:  Absent Seminal vesicle involvement: Absent Neurovascular bundle involvement: Absent Pelvic adenopathy: Absent Bone metastasis: Absent Other findings: No significant free fluid. Normal urinary bladder. Small fat containing right inguinal hernia. Trace bilateral scrotal fluid is likely physiologic. IMPRESSION: 1. No  findings to suggest high-grade or macroscopic prostate carcinoma. 2. Diffuse T2 hypointensity and early post-contrast enhancement throughout the peripheral zone, relatively similar and suspicious for prostatitis. Electronically Signed   By: Abigail Miyamoto M.D.   On: 01/01/2019 13:45    Assessment & Plan:   Problem List Items Addressed This Visit     Anxiety about health    Lorazepam Rx low dose prn  Potential benefits of a short/long term benzodiazepines  use as well as potential risks  and complications were explained to the patient and were aknowledged.      Elevated PSA    Seeing Dr Luther Hearing at [facility] MRI Impression:  PI-RADS 5 lesion of the left posterior lateral peripheral zone at the mid  gland which demonstrates findings concerning for extraprostatic extension  involving the left neurovascular bundle.   Electronically Reviewed by:  Oren Beckmann, MD, [facility]  Electronically Reviewed on:  06/03/2021 9:29 AM   Bx at [place] in 08/2021 was (-)  Bx in [place] in 1/23: Prostatic adenocarcinoma, Gleason grade 3 + 3 = 6/10 (Grade Group 1).        Essential hypertension    BP ok at home Cont on Triamt/HCTZ and amlodipine      Relevant Orders   Comprehensive metabolic panel   CBC with Differential/Platelet   Prostate CA ([place])    Sch f/u w/Dr Luther Hearing at [facility]  Urology Prostae MRI Impression:  PI-RADS 5 lesion of the left posterior lateral peripheral zone at the mid  gland which demonstrates findings concerning for extraprostatic extension  involving the left neurovascular bundle.   Electronically Reviewed by:  Oren Beckmann, MD, [facility]  Electronically Reviewed on:  06/03/2021 9:29 AM   Bx at [place] in 08/2021 was (-)  Bx in [place] in 1/23: Prostatic adenocarcinoma, Gleason grade 3 + 3 = 6/10 (Grade Group 1).        Vitamin D deficiency    On Vit D         No orders of the defined types were placed in this encounter.     Follow-up: Return in about 6 months (around 04/16/2022) for a follow-up visit.  Walker Kehr, MD

## 2021-10-14 NOTE — Assessment & Plan Note (Signed)
Lorazepam Rx low dose prn  Potential benefits of a short/long term benzodiazepines  use as well as potential risks  and complications were explained to the patient and were aknowledged.

## 2021-10-14 NOTE — Assessment & Plan Note (Signed)
BP ok at home Cont on Triamt/HCTZ and amlodipine

## 2021-10-16 NOTE — Assessment & Plan Note (Signed)
Seeing Dr Luther Hearing at [facility] MRI Impression:  PI-RADS 5 lesion of the left posterior lateral peripheral zone at the mid  gland which demonstrates findings concerning for extraprostaticextension  involving the left neurovascular bundle.   Electronically Reviewed by: Oren Beckmann, MD, [facility]  Electronically Reviewed on: 06/03/2021 9:29 AM   Bx at [place] in 08/2021 was (-)  Bx in [place] in 1/23: Prostatic adenocarcinoma, Gleason grade 3 + 3 = 6/10 (Grade Group 1).

## 2021-10-16 NOTE — Assessment & Plan Note (Signed)
On Vit D 

## 2021-10-18 IMAGING — MR MR PROSTATE WO/W CM
56 series · 56 of 56 positions shown · IV contrast (18ml multihance)
Comparison: 07/25/2017

CLINICAL DATA: Elevated PSA. 20.8 most recent. Prior negative
biopsy.

EXAM:
MR PROSTATE WITHOUT AND WITH CONTRAST
TECHNIQUE: Multiplanar multisequence MRI images were obtained of the pelvis
centered about the prostate. Pre and post contrast images were
obtained.
CONTRAST:  18mL MULTIHANCE GADOBENATE DIMEGLUMINE 529 MG/ML IV SOLN

[Series 4: T1 · axial · 8.0mm · 1.06mm/px · 1 of 28 slices shown (1 of 2)]
[im 1/28]
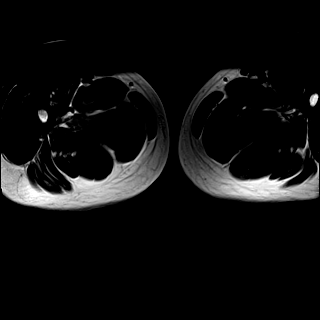

[Series 5: bSSFP fat-sat · axial · 8.0mm · 0.74mm/px · 1 of 28 slices shown]
[im 1/28]
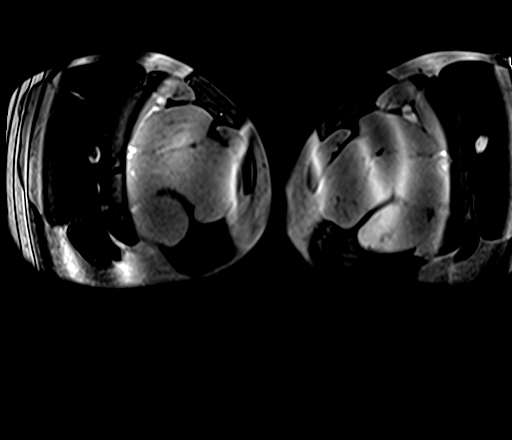

[Series 6: T2 · sagittal · 3.5mm · 0.56mm/px · 1 of 43 slices shown (1 of 4)]
[im 1/43]
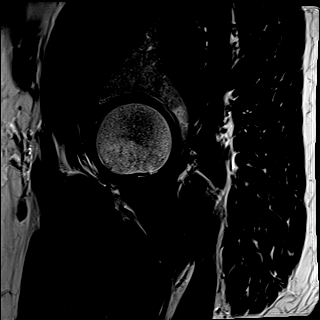

[Series 7: T1 · axial · 3.0mm · 0.31mm/px · 1 of 24 slices shown (2 of 2)]
[im 1/24]
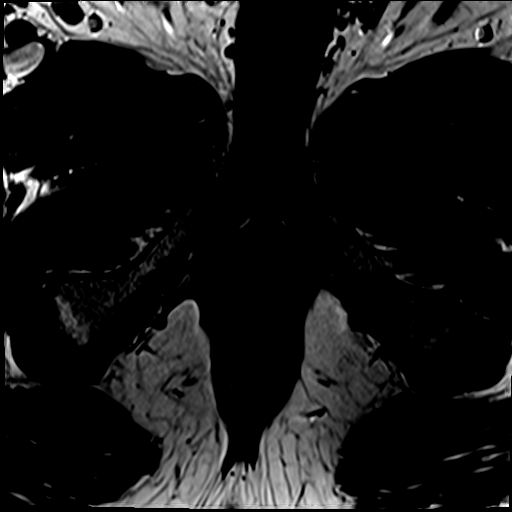

[Series 8: T2 · axial · 3.5mm · 0.56mm/px · 1 of 24 slices shown (2 of 4)]
[im 1/24]
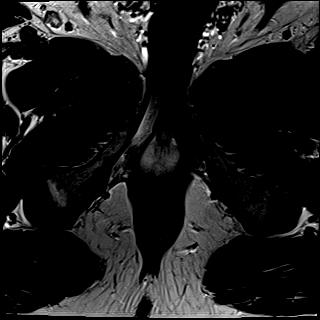

[Series 9: T2 · axial · 1.0mm · 1.04mm/px · 1 of 80 slices shown (3 of 4)]
[im 1/80]
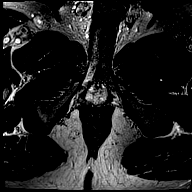

[Series 10: T2 · coronal · 3.5mm · 0.56mm/px · 1 of 20 slices shown (4 of 4)]
[im 1/20]
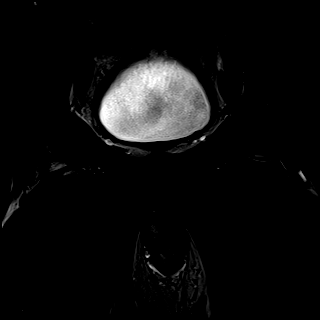

[Series 11: DWI · axial · 3.5mm · 1.56mm/px · 1 of 63 slices shown (1 of 2)]
[im 1/63]
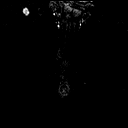

[Series 12: DWI · axial · 3.5mm · 1.56mm/px · 1 of 21 slices shown (2 of 2)]
[im 1/21]
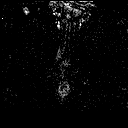

[Series 13: pre t1_twist_tra_dyn_ttc=5.8s · axial · non-contrast · 3.5mm · 0.83mm/px · 1 of 22 slices shown]
[im 1/22]
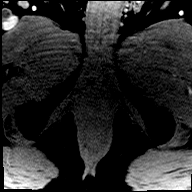

[Series 14: post t1_twist_tra_dyn-copy center · axial · 3.5mm · 0.83mm/px · 1 of 22 slices shown (1 of 24)]
[im 1/22]
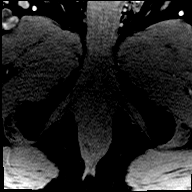

[Series 15: post t1_twist_tra_dyn-copy center · axial · 3.5mm · 0.83mm/px · 1 of 22 slices shown (2 of 24)]
[im 1/22]
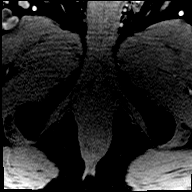

[Series 16: post t1_twist_tra_dyn-copy cent_sub_ttc=(id) · axial · 3.5mm · 0.83mm/px · 1 of 20 slices shown (1 of 22)]
[im 1/20]
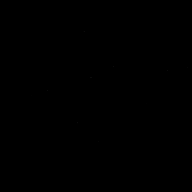

[Series 17: post t1_twist_tra_dyn-copy center · axial · 3.5mm · 0.83mm/px · 1 of 22 slices shown (3 of 24)]
[im 1/22]
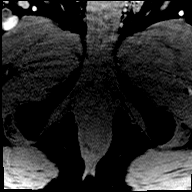

[Series 18: post t1_twist_tra_dyn-copy cent_sub_ttc=(id) · axial · 3.5mm · 0.83mm/px · 1 of 22 slices shown (2 of 22)]
[im 1/22]
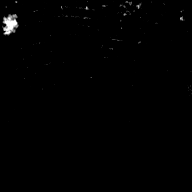

[Series 19: post t1_twist_tra_dyn-copy center · axial · 3.5mm · 0.83mm/px · 1 of 22 slices shown (4 of 24)]
[im 1/22]
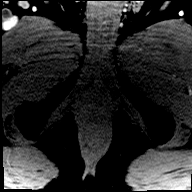

[Series 20: post t1_twist_tra_dyn-copy cent_sub_ttc=(id) · axial · 3.5mm · 0.83mm/px · 1 of 22 slices shown (3 of 22)]
[im 1/22]
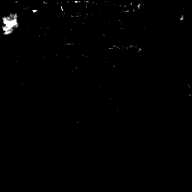

[Series 21: post t1_twist_tra_dyn-copy center · axial · 3.5mm · 0.83mm/px · 1 of 22 slices shown (5 of 24)]
[im 1/22]
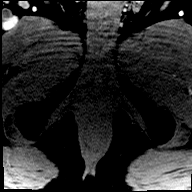

[Series 22: post t1_twist_tra_dyn-copy cent_sub_ttc=(id) · axial · 3.5mm · 0.83mm/px · 1 of 22 slices shown (4 of 22)]
[im 1/22]
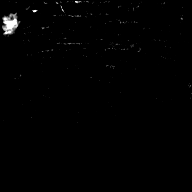

[Series 23: post t1_twist_tra_dyn-copy center · axial · 3.5mm · 0.83mm/px · 1 of 22 slices shown (6 of 24)]
[im 1/22]
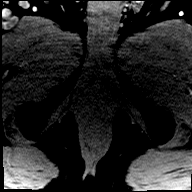

[Series 24: post t1_twist_tra_dyn-copy cent_sub_ttc=(id) · axial · 3.5mm · 0.83mm/px · 1 of 22 slices shown (5 of 22)]
[im 1/22]
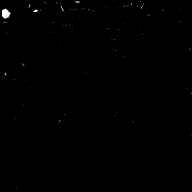

[Series 25: post t1_twist_tra_dyn-copy center · axial · 3.5mm · 0.83mm/px · 1 of 22 slices shown (7 of 24)]
[im 1/22]
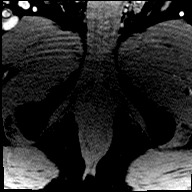

[Series 26: post t1_twist_tra_dyn-copy cent_sub_ttc=(id) · axial · 3.5mm · 0.83mm/px · 1 of 22 slices shown (6 of 22)]
[im 1/22]
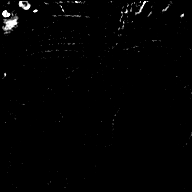

[Series 27: post t1_twist_tra_dyn-copy center · axial · 3.5mm · 0.83mm/px · 1 of 22 slices shown (8 of 24)]
[im 1/22]
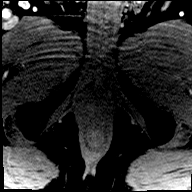

[Series 28: post t1_twist_tra_dyn-copy cent_sub_ttc=(id) · axial · 3.5mm · 0.83mm/px · 1 of 22 slices shown (7 of 22)]
[im 1/22]
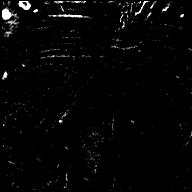

[Series 29: post t1_twist_tra_dyn-copy center · axial · 3.5mm · 0.83mm/px · 1 of 22 slices shown (9 of 24)]
[im 1/22]
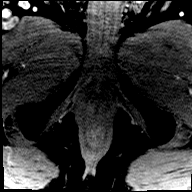

[Series 30: post t1_twist_tra_dyn-copy cent_sub_ttc=(id) · axial · 3.5mm · 0.83mm/px · 1 of 22 slices shown (8 of 22)]
[im 1/22]
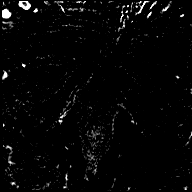

[Series 31: post t1_twist_tra_dyn-copy center · axial · 3.5mm · 0.83mm/px · 1 of 22 slices shown (10 of 24)]
[im 1/22]
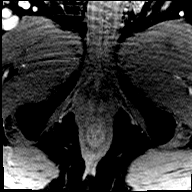

[Series 32: post t1_twist_tra_dyn-copy cent_sub_ttc=(id) · axial · 3.5mm · 0.83mm/px · 1 of 22 slices shown (9 of 22)]
[im 1/22]
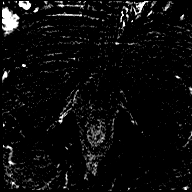

[Series 33: post t1_twist_tra_dyn-copy center · axial · 3.5mm · 0.83mm/px · 1 of 22 slices shown (11 of 24)]
[im 1/22]
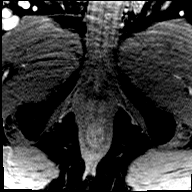

[Series 34: post t1_twist_tra_dyn-copy cent_sub_ttc=(id) · axial · 3.5mm · 0.83mm/px · 1 of 22 slices shown (10 of 22)]
[im 1/22]
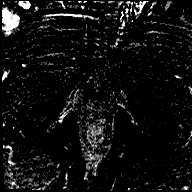

[Series 35: post t1_twist_tra_dyn-copy center · axial · 3.5mm · 0.83mm/px · 1 of 22 slices shown (12 of 24)]
[im 1/22]
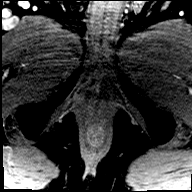

[Series 36: post t1_twist_tra_dyn-copy cent_sub_ttc=(id) · axial · 3.5mm · 0.83mm/px · 1 of 22 slices shown (11 of 22)]
[im 1/22]
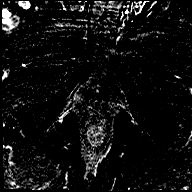

[Series 37: post t1_twist_tra_dyn-copy center · axial · 3.5mm · 0.83mm/px · 1 of 22 slices shown (13 of 24)]
[im 1/22]
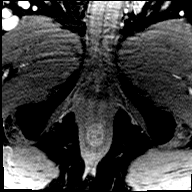

[Series 38: post t1_twist_tra_dyn-copy cent_sub_ttc=(id) · axial · 3.5mm · 0.83mm/px · 1 of 22 slices shown (12 of 22)]
[im 1/22]
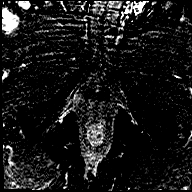

[Series 39: post t1_twist_tra_dyn-copy center · axial · 3.5mm · 0.83mm/px · 1 of 22 slices shown (14 of 24)]
[im 1/22]
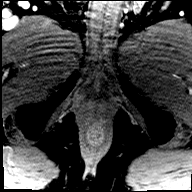

[Series 40: post t1_twist_tra_dyn-copy cent_sub_ttc=(id) · axial · 3.5mm · 0.83mm/px · 1 of 22 slices shown (13 of 22)]
[im 1/22]
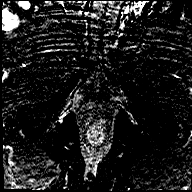

[Series 41: post t1_twist_tra_dyn-copy center · axial · 3.5mm · 0.83mm/px · 1 of 22 slices shown (15 of 24)]
[im 1/22]
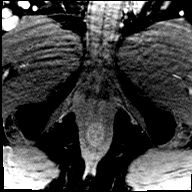

[Series 42: post t1_twist_tra_dyn-copy cent_sub_ttc=(id) · axial · 3.5mm · 0.83mm/px · 1 of 22 slices shown (14 of 22)]
[im 1/22]
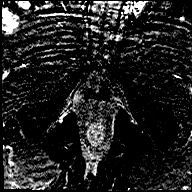

[Series 43: post t1_twist_tra_dyn-copy center · axial · 3.5mm · 0.83mm/px · 1 of 22 slices shown (16 of 24)]
[im 1/22]
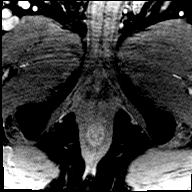

[Series 44: post t1_twist_tra_dyn-copy cent_sub_ttc=(id) · axial · 3.5mm · 0.83mm/px · 1 of 22 slices shown (15 of 22)]
[im 1/22]
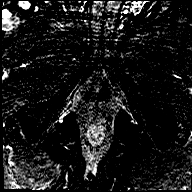

[Series 45: post t1_twist_tra_dyn-copy center · axial · 3.5mm · 0.83mm/px · 1 of 22 slices shown (17 of 24)]
[im 1/22]
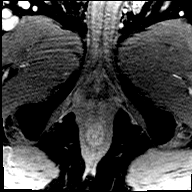

[Series 46: post t1_twist_tra_dyn-copy cent_sub_ttc=(id) · axial · 3.5mm · 0.83mm/px · 1 of 22 slices shown (16 of 22)]
[im 1/22]
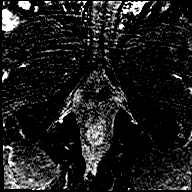

[Series 47: post t1_twist_tra_dyn-copy center · axial · 3.5mm · 0.83mm/px · 1 of 22 slices shown (18 of 24)]
[im 1/22]
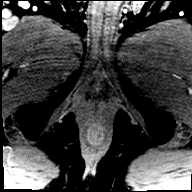

[Series 48: post t1_twist_tra_dyn-copy cent_sub_ttc=(id) · axial · 3.5mm · 0.83mm/px · 1 of 22 slices shown (17 of 22)]
[im 1/22]
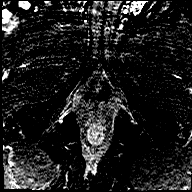

[Series 49: post t1_twist_tra_dyn-copy center · axial · 3.5mm · 0.83mm/px · 1 of 22 slices shown (19 of 24)]
[im 1/22]
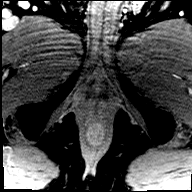

[Series 50: post t1_twist_tra_dyn-copy cent_sub_ttc=(id) · axial · 3.5mm · 0.83mm/px · 1 of 22 slices shown (18 of 22)]
[im 1/22]
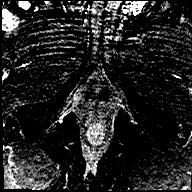

[Series 51: post t1_twist_tra_dyn-copy center · axial · 3.5mm · 0.83mm/px · 1 of 22 slices shown (20 of 24)]
[im 1/22]
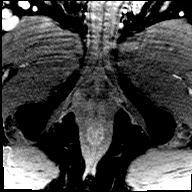

[Series 52: post t1_twist_tra_dyn-copy cent_sub_ttc=(id) · axial · 3.5mm · 0.83mm/px · 1 of 22 slices shown (19 of 22)]
[im 1/22]
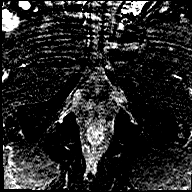

[Series 53: post t1_twist_tra_dyn-copy center · axial · 3.5mm · 0.83mm/px · 1 of 22 slices shown (21 of 24)]
[im 1/22]
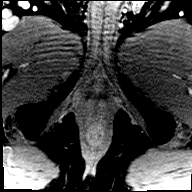

[Series 54: post t1_twist_tra_dyn-copy cent_sub_ttc=(id) · axial · 3.5mm · 0.83mm/px · 1 of 22 slices shown (20 of 22)]
[im 1/22]
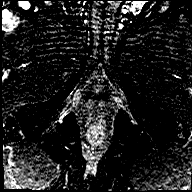

[Series 55: post t1_twist_tra_dyn-copy center · axial · 3.5mm · 0.83mm/px · 1 of 22 slices shown (22 of 24)]
[im 1/22]
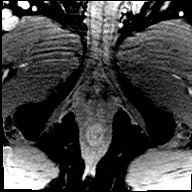

[Series 56: post t1_twist_tra_dyn-copy cent_sub_ttc=(id) · axial · 3.5mm · 0.83mm/px · 1 of 22 slices shown (21 of 22)]
[im 1/22]
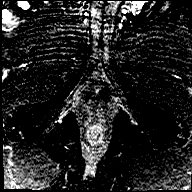

[Series 57: post t1_twist_tra_dyn-copy center · axial · 3.5mm · 0.83mm/px · 1 of 22 slices shown (23 of 24)]
[im 1/22]
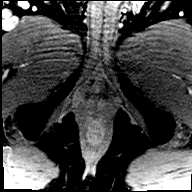

[Series 58: post t1_twist_tra_dyn-copy cent_sub_ttc=(id) · axial · 3.5mm · 0.83mm/px · 1 of 22 slices shown (22 of 22)]
[im 1/22]
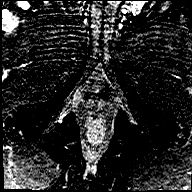

[Series 59: post t1_twist_tra_dyn-copy center · axial · 3.5mm · 0.83mm/px · 1 of 22 slices shown (24 of 24)]
[im 1/22]
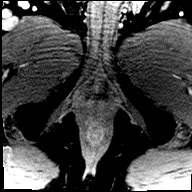

[56 of 56 positions shown; findings below may reference images not displayed]

FINDINGS: Prostate: Demonstrates relatively mild central gland enlargement and
heterogeneity, consistent with benign prostatic hyperplasia. Right
mid gland well-circumscribed central gland nodule of 1.9 cm is
similar to on the prior exam and has benign characteristics (
PI-RADS(v2.1)-) 2.).

Felt to be similar to on the prior exam. No areas of restricted
diffusion. Relatively diffuse early post-contrast enhancement
throughout the peripheral zone, including on series 26.

Volume: 4.3 x 3.3 x 3.9 cm (volume = 29 cm^3)

Transcapsular spread:  Absent

Seminal vesicle involvement: Absent

Neurovascular bundle involvement: Absent

Pelvic adenopathy: Absent

Bone metastasis: Absent

Other findings: No significant free fluid. Normal urinary bladder.
Small fat containing right inguinal hernia. Trace bilateral scrotal
fluid is likely physiologic.
IMPRESSION: 1. No findings to suggest high-grade or macroscopic prostate
carcinoma.
2. Diffuse T2 hypointensity and early post-contrast enhancement
throughout the peripheral zone, relatively similar and suspicious
for prostatitis.

## 2021-10-22 ENCOUNTER — Other Ambulatory Visit: Payer: Self-pay | Admitting: Internal Medicine

## 2021-11-08 ENCOUNTER — Other Ambulatory Visit: Payer: Self-pay | Admitting: Internal Medicine

## 2021-11-25 ENCOUNTER — Other Ambulatory Visit: Payer: Self-pay | Admitting: Internal Medicine

## 2021-12-21 ENCOUNTER — Telehealth: Payer: Self-pay | Admitting: Internal Medicine

## 2021-12-21 NOTE — Telephone Encounter (Signed)
Pt has dropped off FMLA paperwork to be filled out by Dr. Alain Marion, there is a business card with the fax number for once the paperwork is complete.

## 2021-12-22 NOTE — Telephone Encounter (Signed)
Place form in MD purple folder for signature.Marland KitchenJohny Livingston

## 2021-12-23 NOTE — Telephone Encounter (Signed)
Called pt no answer LMOM forms has been faxed and the original is ready for pick-up./lmb

## 2021-12-23 NOTE — Telephone Encounter (Signed)
Okay.  Thanks.

## 2022-05-06 ENCOUNTER — Other Ambulatory Visit: Payer: Self-pay | Admitting: Internal Medicine

## 2022-06-20 ENCOUNTER — Telehealth: Payer: Self-pay | Admitting: Internal Medicine

## 2022-06-20 NOTE — Telephone Encounter (Signed)
Patient dropped off document FMLA, to be filled out by provider. Patient requested to send it via Call Patient to pick up within 7-days. Document is located in providers tray at front office.Please advise at [facility] [phone]

## 2022-06-21 NOTE — Telephone Encounter (Signed)
Rec'd FMLA fill-out & place on MD desk for signature.Marland KitchenRaechel Chute

## 2022-06-22 ENCOUNTER — Encounter: Payer: Self-pay | Admitting: Podiatry

## 2022-06-22 ENCOUNTER — Other Ambulatory Visit: Payer: Self-pay | Admitting: Podiatry

## 2022-06-22 ENCOUNTER — Ambulatory Visit (INDEPENDENT_AMBULATORY_CARE_PROVIDER_SITE_OTHER): Payer: 59

## 2022-06-22 ENCOUNTER — Ambulatory Visit: Payer: 59 | Admitting: Podiatry

## 2022-06-22 DIAGNOSIS — M79671 Pain in right foot: Secondary | ICD-10-CM

## 2022-06-22 DIAGNOSIS — M7751 Other enthesopathy of right foot: Secondary | ICD-10-CM

## 2022-06-22 MED ORDER — METHYLPREDNISOLONE 4 MG PO TBPK: ORAL_TABLET | ORAL | 0 refills | Status: DC

## 2022-06-22 MED ORDER — TRIAMCINOLONE ACETONIDE 10 MG/ML IJ SUSP
10.0000 mg | Freq: Once | INTRAMUSCULAR | Status: AC
Start: 2022-06-22 — End: 2022-06-22
  Administered 2022-06-22: 10 mg

## 2022-06-22 NOTE — Progress Notes (Signed)
Subjective:   Patient ID: Joseph Livingston, male   DOB: 57 y.o.   MRN: [id number]   HPI Patient presents stating he is having a lot of pain in his right ankle and midfoot after wearing the wrong pair shoes and has some swelling and he does work on cement floors extended hours.  States that it has been inflamed and hard to walk with.  Patient has had no changes in health history   ROS      Objective:  Physical Exam  Neurovascular status intact with inflammation pain of the sinus tarsi right fluid buildup but also extends more dorsal and has swelling with +1 to +2 pitting edema noted negative Homans' sign     Assessment:  Appears to be inflammatory capsulitis of the right sinus tarsi extending into the ankle acute in nature cannot rule out systemic cause     Plan:  H&P x-rays reviewed sterile prep injected the sinus tarsi right 3 mg Kenalog 5 mg Xylocaine and dispensed ankle compression stocking to take pressure off his foot and reappoint for [place] to recheck again as symptoms indicate  X-rays indicate some spurring of the talus right and into the midfoot indicating probability for arthritic condition

## 2022-06-22 NOTE — Telephone Encounter (Signed)
Signed. Thanks.

## 2022-06-22 NOTE — Telephone Encounter (Signed)
Called pt inform FMLA ready for pick-up. Pt is wanting form fax to [phone].Marland KitchenRaechel Chute

## 2022-08-15 ENCOUNTER — Other Ambulatory Visit: Payer: Self-pay | Admitting: Internal Medicine

## 2022-08-18 ENCOUNTER — Other Ambulatory Visit: Payer: Self-pay | Admitting: Internal Medicine

## 2022-11-11 ENCOUNTER — Other Ambulatory Visit: Payer: Self-pay | Admitting: Internal Medicine

## 2022-11-14 ENCOUNTER — Other Ambulatory Visit: Payer: Self-pay | Admitting: Internal Medicine

## 2022-11-14 ENCOUNTER — Encounter: Payer: Self-pay | Admitting: Internal Medicine

## 2022-11-16 NOTE — Telephone Encounter (Signed)
Pt has called and made an apptmnt for 12/08/2022 at 10:30 am. Pt asked can he please get a refill to last him until his apptmnt date as his medication will run out in 2 days.

## 2022-11-17 ENCOUNTER — Other Ambulatory Visit: Payer: Self-pay | Admitting: Internal Medicine

## 2022-11-17 MED ORDER — TRIAMTERENE-HCTZ 37.5-25 MG PO TABS: 1.0000 | ORAL_TABLET | Freq: Every day | ORAL | 0 refills | Status: DC

## 2022-11-17 MED ORDER — AMLODIPINE BESYLATE 2.5 MG PO TABS: 2.5000 mg | ORAL_TABLET | Freq: Every day | ORAL | 0 refills | Status: DC

## 2022-12-08 ENCOUNTER — Encounter: Payer: Self-pay | Admitting: Internal Medicine

## 2022-12-08 ENCOUNTER — Ambulatory Visit: Payer: 59 | Admitting: Internal Medicine

## 2022-12-08 VITALS — BP 130/80 | HR 80 | Temp 98.2°F | Ht 67.0 in | Wt 193.0 lb

## 2022-12-08 DIAGNOSIS — R972 Elevated prostate specific antigen [PSA]: Secondary | ICD-10-CM

## 2022-12-08 DIAGNOSIS — I2510 Atherosclerotic heart disease of native coronary artery without angina pectoris: Secondary | ICD-10-CM

## 2022-12-08 DIAGNOSIS — R7989 Other specified abnormal findings of blood chemistry: Secondary | ICD-10-CM

## 2022-12-08 DIAGNOSIS — Z0000 Encounter for general adult medical examination without abnormal findings: Secondary | ICD-10-CM

## 2022-12-08 DIAGNOSIS — E785 Hyperlipidemia, unspecified: Secondary | ICD-10-CM | POA: Diagnosis not present

## 2022-12-08 DIAGNOSIS — F439 Reaction to severe stress, unspecified: Secondary | ICD-10-CM

## 2022-12-08 DIAGNOSIS — C61 Malignant neoplasm of prostate: Secondary | ICD-10-CM

## 2022-12-08 DIAGNOSIS — E559 Vitamin D deficiency, unspecified: Secondary | ICD-10-CM

## 2022-12-08 DIAGNOSIS — I2583 Coronary atherosclerosis due to lipid rich plaque: Secondary | ICD-10-CM

## 2022-12-08 LAB — COMPREHENSIVE METABOLIC PANEL
ALT: 98 U/L — ABNORMAL HIGH (ref 0–53)
AST: 87 U/L — ABNORMAL HIGH (ref 0–37)
Albumin: 5.2 g/dL (ref 3.5–5.2)
Alkaline Phosphatase: 63 U/L (ref 39–117)
BUN: 12 mg/dL (ref 6–23)
CO2: 30 meq/L (ref 19–32)
Calcium: 10.7 mg/dL — ABNORMAL HIGH (ref 8.4–10.5)
Chloride: 93 meq/L — ABNORMAL LOW (ref 96–112)
Creatinine, Ser: 1.13 mg/dL (ref 0.40–1.50)
GFR: 72.66 mL/min (ref >60.00)
Glucose, Bld: 113 mg/dL — ABNORMAL HIGH (ref 70–99)
Potassium: 4.4 meq/L (ref 3.5–5.1)
Sodium: 133 meq/L — ABNORMAL LOW (ref 135–145)
Total Bilirubin: 1.4 mg/dL — ABNORMAL HIGH (ref 0.2–1.2)
Total Protein: 8.8 g/dL — ABNORMAL HIGH (ref 6.0–8.3)

## 2022-12-08 LAB — URINALYSIS
Bilirubin Urine: NEGATIVE
Hgb urine dipstick: NEGATIVE
Ketones, ur: NEGATIVE
Leukocytes,Ua: NEGATIVE
Nitrite: NEGATIVE
Specific Gravity, Urine: 1.01 (ref 1.000–1.030)
Total Protein, Urine: NEGATIVE
Urine Glucose: NEGATIVE
Urobilinogen, UA: 0.2 (ref 0.0–1.0)
pH: 7.5 (ref 5.0–8.0)

## 2022-12-08 LAB — CBC WITH DIFFERENTIAL/PLATELET
Basophils Absolute: 0 10*3/uL (ref 0.0–0.1)
Basophils Relative: 0.8 % (ref 0.0–3.0)
Eosinophils Absolute: 0.1 10*3/uL (ref 0.0–0.7)
Eosinophils Relative: 1.6 % (ref 0.0–5.0)
HCT: 46.6 % (ref 39.0–52.0)
Hemoglobin: 15.6 g/dL (ref 13.0–17.0)
Lymphocytes Relative: 31.1 % (ref 12.0–46.0)
Lymphs Abs: 1.4 10*3/uL (ref 0.7–4.0)
MCHC: 33.5 g/dL (ref 30.0–36.0)
MCV: 91.1 fL (ref 78.0–100.0)
Monocytes Absolute: 0.4 10*3/uL (ref 0.1–1.0)
Monocytes Relative: 8.7 % (ref 3.0–12.0)
Neutro Abs: 2.6 10*3/uL (ref 1.4–7.7)
Neutrophils Relative %: 57.8 % (ref 43.0–77.0)
Platelets: 265 10*3/uL (ref 150.0–400.0)
RBC: 5.12 Mil/uL (ref 4.22–5.81)
RDW: 13.3 % (ref 11.5–15.5)
WBC: 4.4 10*3/uL (ref 4.0–10.5)

## 2022-12-08 LAB — LIPID PANEL
Cholesterol: 327 mg/dL — ABNORMAL HIGH (ref 0–200)
HDL: 66.6 mg/dL (ref >39.00)
LDL Cholesterol: 208 mg/dL — ABNORMAL HIGH (ref 0–99)
NonHDL: 260.6
Total CHOL/HDL Ratio: 5
Triglycerides: 261 mg/dL — ABNORMAL HIGH (ref 0.0–149.0)
VLDL: 52.2 mg/dL — ABNORMAL HIGH (ref 0.0–40.0)

## 2022-12-08 LAB — TSH: TSH: 1.52 u[IU]/mL (ref 0.35–5.50)

## 2022-12-08 MED ORDER — SILDENAFIL CITRATE 100 MG PO TABS: ORAL_TABLET | ORAL | 5 refills | Status: DC

## 2022-12-08 NOTE — Assessment & Plan Note (Signed)
Treat dyslipidemia On diet Wt Readings from Last 3 Encounters:  12/08/22 193 lb (87.5 kg)  10/14/21 195 lb (88.5 kg)  10/01/20 199 lb 9.6 oz (90.5 kg)

## 2022-12-08 NOTE — Assessment & Plan Note (Signed)
Seeing Dr Luther Hearing at [facility] MRI Impression:  PI-RADS 5 lesion of the left posterior lateral peripheral zone at the mid  gland which demonstrates findings concerning for extraprostaticextension  involving the left neurovascular bundle.   Electronically Reviewed by: Oren Beckmann, MD, [facility]  Electronically Reviewed on: 06/03/2021 9:29 AM   Bx at [place] in 08/2021 was (-)  Bx in [place] in 1/23: Prostatic adenocarcinoma, Gleason grade 3 + 3 = 6/10 (Grade Group 1).

## 2022-12-08 NOTE — Assessment & Plan Note (Signed)
2020 IMPRESSION: Coronary calcium score of 62.4. This was 10 percentile for age and sex matched control. Pravastatin and continue Zetia

## 2022-12-08 NOTE — Assessment & Plan Note (Signed)
Stable

## 2022-12-08 NOTE — Assessment & Plan Note (Signed)
CT calc score is 62 Pravastatin and Zetia

## 2022-12-08 NOTE — Assessment & Plan Note (Signed)
We discussed age appropriate health related issues, including available/recomended screening tests and vaccinations. We discussed a need for adhering to healthy diet and exercise. Labs were ordered to be later reviewed . All questions were answered. Coronary calcium score of 62.4. This was 95 percentile for age and sex matched control.

## 2022-12-08 NOTE — Progress Notes (Signed)
Subjective:  Patient ID: Joseph Livingston, male    DOB: 02-19-1966  Age: 57 y.o. MRN: [id number]  CC: Annual Exam   HPI [name] presents for a well exam  Outpatient Medications Prior to Visit  Medication Sig Dispense Refill   allopurinol (ZYLOPRIM) 100 MG tablet Take 1 tablet (100 mg total) by mouth daily. 30 tablet 6   AMBULATORY NON FORMULARY MEDICATION Medication Name: whey powder     AMBULATORY NON FORMULARY MEDICATION Medication Name: tumeric     amLODipine (NORVASC) 2.5 MG tablet Take 1 tablet (2.5 mg total) by mouth daily. 90 tablet 0   aspirin 81 MG tablet Take 81 mg by mouth daily.     Cholecalciferol (VITAMIN D3) 2000 units capsule Take 1 capsule (2,000 Units total) by mouth daily. 100 capsule 3   ezetimibe (ZETIA) 10 MG tablet TAKE 1 TABLET BY MOUTH EVERY DAY 90 tablet 3   icosapent Ethyl (VASCEPA) 1 g capsule TAKE 2 CAPSULES BY MOUTH TWICE A DAY 360 capsule 0   LORazepam (ATIVAN) 1 MG tablet Take 0.5-1 tablets (0.5-1 mg total) by mouth 2 (two) times daily as needed for anxiety. 60 tablet 1   MELATONIN PO Take by mouth at bedtime.     methylPREDNISolone (MEDROL DOSEPAK) 4 MG TBPK tablet follow package directions 21 tablet 0   pravastatin (PRAVACHOL) 20 MG tablet TAKE 1 TABLET BY MOUTH EVERY DAY 90 tablet 3   triamterene-hydrochlorothiazide (MAXZIDE-25) 37.5-25 MG tablet Take 1 tablet by mouth daily. Annual appt due in August must see provider for future refills 90 tablet 0   sildenafil (VIAGRA) 100 MG tablet TAKE 1 TABLET BY MOUTH EVERY DAY AS NEEDED FOR ERECTILE DYSFUNCTION 6 tablet 5   No facility-administered medications prior to visit.    ROS: Review of Systems  Constitutional:  Negative for appetite change, fatigue and unexpected weight change.  HENT:  Negative for congestion, nosebleeds, sneezing, sore throat and trouble swallowing.   Eyes:  Negative for itching and visual disturbance.  Respiratory:  Negative for cough.   Cardiovascular:   Negative for chest pain, palpitations and leg swelling.  Gastrointestinal:  Negative for abdominal distention, blood in stool, diarrhea and nausea.  Genitourinary:  Negative for frequency and hematuria.  Musculoskeletal:  Negative for back pain, gait problem, joint swelling and neck pain.  Skin:  Negative for rash.  Neurological:  Negative for dizziness, tremors, speech difficulty and weakness.  Psychiatric/Behavioral:  Negative for agitation, dysphoric mood and sleep disturbance. The patient is not nervous/anxious.     Objective:  BP 130/80 (BP Location: Left Arm, Patient Position: Sitting, Cuff Size: Normal)   Pulse 80   Temp 98.2 F (36.8 C) (Oral)   Ht 5\' 7"  (1.702 m)   Wt 193 lb (87.5 kg)   SpO2 95%   BMI 30.23 kg/m   BP Readings from Last 3 Encounters:  12/08/22 130/80  10/14/21 (!) 140/90  10/01/20 (!) 152/90    Wt Readings from Last 3 Encounters:  12/08/22 193 lb (87.5 kg)  10/14/21 195 lb (88.5 kg)  10/01/20 199 lb 9.6 oz (90.5 kg)    Physical Exam Constitutional:      General: He is not in acute distress.    Appearance: Normal appearance. He is well-developed.     Comments: NAD  Eyes:     Conjunctiva/sclera: Conjunctivae normal.     Pupils: Pupils are equal, round, and reactive to light.  Neck:     Thyroid: No thyromegaly.  Vascular: No JVD.  Cardiovascular:     Rate and Rhythm: Normal rate and regular rhythm.     Heart sounds: Normal heart sounds. No murmur heard.    No friction rub. No gallop.  Pulmonary:     Effort: Pulmonary effort is normal. No respiratory distress.     Breath sounds: Normal breath sounds. No wheezing or rales.  Chest:     Chest wall: No tenderness.  Abdominal:     General: Bowel sounds are normal. There is no distension.     Palpations: Abdomen is soft. There is no mass.     Tenderness: There is no abdominal tenderness. There is no guarding or rebound.  Musculoskeletal:        General: No tenderness. Normal range of motion.      Cervical back: Normal range of motion.  Lymphadenopathy:     Cervical: No cervical adenopathy.  Skin:    General: Skin is warm and dry.     Findings: No rash.  Neurological:     Mental Status: He is alert and oriented to person, place, and time.     Cranial Nerves: No cranial nerve deficit.     Motor: No abnormal muscle tone.     Coordination: Coordination normal.     Gait: Gait normal.     Deep Tendon Reflexes: Reflexes are normal and symmetric.  Psychiatric:        Behavior: Behavior normal.        Thought Content: Thought content normal.        Judgment: Judgment normal.     Lab Results  Component Value Date   WBC 4.6 06/24/2020   HGB 14.8 06/24/2020   HCT 43.2 06/24/2020   PLT 221.0 06/24/2020   GLUCOSE 96 10/01/2020   CHOL 263 (H) 10/01/2020   TRIG 218.0 (H) 10/01/2020   HDL 60.50 10/01/2020   LDLDIRECT 161.0 10/01/2020   ALT 131 (H) 10/01/2020   AST 74 (H) 10/01/2020   NA 133 (L) 10/01/2020   K 4.2 10/01/2020   CL 95 (L) 10/01/2020   CREATININE 1.19 10/01/2020   BUN 17 10/01/2020   CO2 28 10/01/2020   TSH 2.02 06/24/2020   PSA 28.79 (H) 06/24/2020   HGBA1C 6.2 06/24/2020    MR PROSTATE W WO CONTRAST  Result Date: 01/01/2019 CLINICAL DATA:  Elevated PSA. 20.8 most recent. Prior negative biopsy. EXAM: MR PROSTATE WITHOUT AND WITH CONTRAST TECHNIQUE: Multiplanar multisequence MRI images were obtained of the pelvis centered about the prostate. Pre and post contrast images were obtained. CONTRAST:  18mL MULTIHANCE GADOBENATE DIMEGLUMINE 529 MG/ML IV SOLN COMPARISON:  07/25/2017 FINDINGS: Prostate: Demonstrates relatively mild central gland enlargement and heterogeneity, consistent with benign prostatic hyperplasia. Right mid gland well-circumscribed central gland nodule of 1.9 cm is similar to on the prior exam and has benign characteristics ( PI-RADS(v2.1)-) 2.). Felt to be similar to on the prior exam. No areas of restricted diffusion. Relatively diffuse early  post-contrast enhancement throughout the peripheral zone, including on series 26. Volume: 4.3 x 3.3 x 3.9 cm (volume = 29 cm^3) Transcapsular spread:  Absent Seminal vesicle involvement: Absent Neurovascular bundle involvement: Absent Pelvic adenopathy: Absent Bone metastasis: Absent Other findings: No significant free fluid. Normal urinary bladder. Small fat containing right inguinal hernia. Trace bilateral scrotal fluid is likely physiologic. IMPRESSION: 1. No findings to suggest high-grade or macroscopic prostate carcinoma. 2. Diffuse T2 hypointensity and early post-contrast enhancement throughout the peripheral zone, relatively similar and suspicious for prostatitis. Electronically Signed  By: Jeronimo Greaves M.D.   On: 01/01/2019 13:45    Assessment & Plan:   Problem List Items Addressed This Visit     Well adult exam    We discussed age appropriate health related issues, including available/recomended screening tests and vaccinations. We discussed a need for adhering to healthy diet and exercise. Labs were ordered to be later reviewed . All questions were answered. Coronary calcium score of 62.4. This was 37 percentile for age and sex matched control.        Relevant Orders   TSH   Urinalysis   CBC with Differential/Platelet   Lipid panel   Comprehensive metabolic panel   Elevated LFTs    Treat dyslipidemia On diet Wt Readings from Last 3 Encounters:  12/08/22 193 lb (87.5 kg)  10/14/21 195 lb (88.5 kg)  10/01/20 199 lb 9.6 oz (90.5 kg)         Dyslipidemia - Primary    CT calc score is 62 Pravastatin and Zetia      Elevated PSA    Seeing Dr Lestine Mount at [facility] Prostae MRI Impression:  PI-RADS 5 lesion of the left posterior lateral peripheral zone at the mid  gland which demonstrates findings concerning for extraprostatic extension  involving the left neurovascular bundle.   Electronically Reviewed by:  Levonne Hubert, MD, [facility]  Electronically  Reviewed on:  06/03/2021 9:29 AM   Bx at [facility] in 08/2021 was (-)  Bx in [place] in 1/23: Prostatic adenocarcinoma, Gleason grade 3 + 3 = 6/10 (Grade Group 1).        Vitamin D deficiency    On Vit D      Coronary artery disease    2020 IMPRESSION: Coronary calcium score of 62.4. This was 51 percentile for age and sex matched control. Pravastatin and continue Zetia      Relevant Medications   sildenafil (VIAGRA) 100 MG tablet   Stress    Stable      Prostate CA (HCC)    Sch f/u w/Dr Lestine Mount at [facility] Prostae MRI Impression:  PI-RADS 5 lesion of the left posterior lateral peripheral zone at the mid  gland which demonstrates findings concerning for extraprostatic extension  involving the left neurovascular bundle.   Electronically Reviewed by:  Levonne Hubert, MD, [facility]  Electronically Reviewed on:  06/03/2021 9:29 AM   Bx at [facility] in 08/2021 was (-)  Bx in [place] in 1/23: Prostatic adenocarcinoma, Gleason grade 3 + 3 = 6/10 (Grade Group 1).           Meds ordered this encounter  Medications   DISCONTD: sildenafil (VIAGRA) 100 MG tablet    Sig: TAKE 1 TABLET BY MOUTH EVERY DAY AS NEEDED FOR ERECTILE DYSFUNCTION    Dispense:  30 tablet    Refill:  5   sildenafil (VIAGRA) 100 MG tablet    Sig: TAKE 1 TABLET BY MOUTH EVERY DAY AS NEEDED FOR ERECTILE DYSFUNCTION    Dispense:  30 tablet    Refill:  5      Follow-up: Return in about 6 months (around 06/08/2023) for a follow-up visit.  Sonda Primes, MD

## 2022-12-08 NOTE — Assessment & Plan Note (Signed)
On Vit D 

## 2022-12-08 NOTE — Assessment & Plan Note (Signed)
Sch f/u w/Dr Luther Hearing at 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   Electronically Reviewed by: Oren Beckmann, MD, [facility]  Electronically Reviewed on: 06/03/2021 9:29 AM   Bx at [place] in 08/2021 was (-)  Bx in [place] in 1/23: Prostatic adenocarcinoma, Gleason grade 3 + 3 = 6/10 (Grade Group 1).

## 2022-12-23 ENCOUNTER — Telehealth: Payer: Self-pay | Admitting: Internal Medicine

## 2022-12-23 NOTE — Telephone Encounter (Signed)
Patient dropped off document FMLA, to be filled out by provider. Patient requested to send it back via Fax within 7-days. Document is located in providers tray at front office.Please advise at Mobile [phone] (mobile) , Patient would like a call when forms have been faxed off.

## 2022-12-26 NOTE — Telephone Encounter (Signed)
Forms have bee filled out and placed on providers desk for signature.

## 2022-12-28 ENCOUNTER — Other Ambulatory Visit: Payer: Self-pay | Admitting: Internal Medicine

## 2023-01-10 NOTE — Telephone Encounter (Signed)
Patient called to check on the status of his paperwork. He would like a call back at [phone].

## 2023-01-11 NOTE — Telephone Encounter (Signed)
I was ale to speak with the pt and he has stated he did get the confirmation from his job that they received his paperwork that was faxed over on 01/06/2023.  **Pt asked that I mail him the original copy. I have sent the original copy via mail.

## 2023-02-06 ENCOUNTER — Encounter: Payer: Self-pay | Admitting: Internal Medicine

## 2023-02-12 ENCOUNTER — Other Ambulatory Visit: Payer: Self-pay | Admitting: Internal Medicine

## 2023-02-17 ENCOUNTER — Inpatient Hospital Stay
Admission: RE | Admit: 2023-02-17 | Discharge: 2023-02-17 | Disposition: A | Discharge status: Home / Self Care | Payer: Self-pay | Source: Ambulatory Visit | Attending: Radiation Oncology | Admitting: Radiation Oncology

## 2023-02-17 ENCOUNTER — Other Ambulatory Visit: Payer: Self-pay | Admitting: Radiation Oncology

## 2023-02-17 DIAGNOSIS — C61 Malignant neoplasm of prostate: Secondary | ICD-10-CM

## 2023-03-06 NOTE — Progress Notes (Signed)
 GU Location of Tumor / Histology: Prostate Ca  If Prostate Cancer, Gleason Score is (3 + 4) and PSA is (55 on 06/17/2022  PSA  28 on 09/2020   Biopsies     07/26/2022 Cristie Christen Masters, PA PET SBMT with nondiagnostic concurrent CT subsequent PSMA  Indication: 56 years Male Prostate cancer, staging, PSA 55, C61 Malignant neoplasm of prostate   IMPRESSION: *  Prostate: Focal PSMA uptake in the right anterolateral peripheral prostate at the level of the mid gland to apex, consistent with primary malignancy. *  Nodes: No PSMA-avid pelvic, retroperitoneal, or mesenteric adenopathy *  Distant metastases: No visceral or osseous metastatic disease *  Moderate coronary artery calcifications, mildly advanced for age.    Past/Anticipated interventions by urology, if any:   01/31/2023 Dr. Donnice Rockey Lines    Past/Anticipated interventions by medical oncology, if any: NA  Weight changes, if any:  No  IPSS:  1 SHIM: 23  Bowel/Bladder complaints, if any:  No  Nausea/Vomiting, if any:  No  Pain issues, if any:  0/10  SAFETY ISSUES: Prior radiation?  No Pacemaker/ICD? No Possible current pregnancy? Male Is the patient on methotrexate? No  Current Complaints / other details:

## 2023-03-07 ENCOUNTER — Ambulatory Visit
Admission: RE | Admit: 2023-03-07 | Discharge: 2023-03-07 | Discharge status: Home / Self Care | Payer: 59 | Source: Ambulatory Visit | Attending: Radiation Oncology | Admitting: Radiation Oncology

## 2023-03-07 ENCOUNTER — Ambulatory Visit
Admission: RE | Admit: 2023-03-07 | Discharge: 2023-03-07 | Disposition: A | Discharge status: Home / Self Care | Payer: 59 | Source: Ambulatory Visit | Attending: Radiation Oncology | Admitting: Radiation Oncology

## 2023-03-07 ENCOUNTER — Ambulatory Visit: Payer: 59

## 2023-03-07 ENCOUNTER — Encounter: Payer: Self-pay | Admitting: Radiation Oncology

## 2023-03-07 ENCOUNTER — Ambulatory Visit: Payer: 59 | Admitting: Radiation Oncology

## 2023-03-07 VITALS — Temp 98.0°F | Resp 20 | Ht 67.0 in | Wt 197.0 lb

## 2023-03-07 DIAGNOSIS — Z8042 Family history of malignant neoplasm of prostate: Secondary | ICD-10-CM | POA: Diagnosis not present

## 2023-03-07 DIAGNOSIS — Z7982 Long term (current) use of aspirin: Secondary | ICD-10-CM | POA: Diagnosis not present

## 2023-03-07 DIAGNOSIS — K760 Fatty (change of) liver, not elsewhere classified: Secondary | ICD-10-CM | POA: Insufficient documentation

## 2023-03-07 DIAGNOSIS — I10 Essential (primary) hypertension: Secondary | ICD-10-CM | POA: Diagnosis not present

## 2023-03-07 DIAGNOSIS — M109 Gout, unspecified: Secondary | ICD-10-CM | POA: Diagnosis not present

## 2023-03-07 DIAGNOSIS — C61 Malignant neoplasm of prostate: Secondary | ICD-10-CM | POA: Insufficient documentation

## 2023-03-07 DIAGNOSIS — E785 Hyperlipidemia, unspecified: Secondary | ICD-10-CM | POA: Insufficient documentation

## 2023-03-07 DIAGNOSIS — Z79899 Other long term (current) drug therapy: Secondary | ICD-10-CM | POA: Diagnosis not present

## 2023-03-07 NOTE — Progress Notes (Signed)
 Introduced myself to the patient as the prostate nurse navigator.  He is here to discuss his radiation treatment options.  Patient agreeable for me to follow up to ensure final treatment decision.  I gave him my business card and asked him to call me with questions or concerns.  Verbalized understanding.

## 2023-03-07 NOTE — Progress Notes (Signed)
 Radiation Oncology         [phone] ________________________________  Initial Outpatient Consultation  Name: Joseph Livingston MRN: [id number]  Date: 03/07/2023  DOB: Jun 20, 1965  RR:Eonuwpxnc, Karlynn GAILS, MD  Keenan Donnice MARLA, MD   REFERRING PHYSICIAN: Keenan Donnice MARLA, MD  DIAGNOSIS: 57 y.o. gentleman with Stage T1c adenocarcinoma of the prostate with Gleason score of 3+4, and PSA of 55.    ICD-10-CM   1. Malignant neoplasm of prostate (HCC)  C61     2. Prostate CA [facility])  C61       HISTORY OF PRESENT ILLNESS:  Joseph Livingston is a 57 year old male from [place], [place], who presents with a history of prostate-specific antigen (PSA) elevation and subsequent evaluation for prostate malignancy. His PSA was elevated at 28.1 on October 01, 2020, and 28.79 on June 24, 2020. He had previously undergone a prostate biopsy in 2018, which was negative for malignancy.  On March 15, 2021, he underwent a repeat prostate biopsy with Dr. Alvaro. The prostate volume was estimated at 44 cc, and the biopsy revealed the following findings:  Left Prostate:  Multiple sites (base lateral, mid lateral, apex lateral, base, and mid) showed benign prostate tissue. Left apex lateral: Prostatic adenocarcinoma, Gleason grade 3+3=6/10 (Grade Group 1), involving <1 mm of 16 mm total biopsy length. Right Prostate:  Right base, base lateral, and mid lateral: Prostatic adenocarcinoma, Gleason grade 3+3=6/10 (Grade Group 1), with carcinoma involvement ranging from 3 mm to 5 mm. Right mid, mid lateral, and apex: Prostatic adenocarcinoma, Gleason grade 3+4=7/10 (Grade Group 2), with Gleason pattern 4 constituting 5-10% of carcinoma and carcinoma involvement ranging from 4 mm to 16 mm. Right apex lateral: Atypical small acinar proliferation, suspicious but not diagnostic for malignancy. A PSMA PET scan performed on June 02, 2021, identified subtle focal PSMA activity within the inferior right prostate,  consistent with the primary malignancy. There was no evidence of PSMA-avid pelvic, retroperitoneal, or mesenteric lymphadenopathy. No distant metastases, including visceral or osseous metastatic disease, were identified.  He met with Dr. Lamar Ruth at [facility] on 06/16/22 and was recommended to undergo long term ADT with definitive radiation therapy.  The patient was reluctant to proceed and has not received any treatmtent thus far.  His PSA increased further to 55 on 06/17/22 and follow-up PSMA PET scan on 07/26/22 also showed no evidence of any lymph node or metastatic progression.  Prostate MRI on 11/22/22 showed  Right transition zone spanning from base to apex measuring 3.1 x 2.7 x 3.3 cm (volume of 14.99 mL). Noncircumscribed T2 intermediate signal lesion with indistinct margins. Marked diffusion restriction. Matched early postcontrast enhancement. This corresponds with the finding on the prior PSMA PET/CT. PI-RADS 5.   MRI of Prostatic capsule: Lesion 1 demonstrates broad-based capsular abutment  within the right anterolateral apex (series 11, image 18).  So, he saw Dr. Ruth again on 12/27/22 and oncology at [facility] on 01/31/23.  Again, he was advised to consider LT-ADT with IMRT and presents today to discuss receiving treatment locally.   PREVIOUS RADIATION THERAPY: No  PAST MEDICAL HISTORY:  Past Medical History:  Diagnosis Date   Fatty liver    Gout    Hyperlipidemia    Hypertension       PAST SURGICAL HISTORY: Past Surgical History:  Procedure Laterality Date   KNEE SURGERY Right 1983   prostate biopsy      FAMILY HISTORY:  Family History  Problem Relation Age of Onset   Hypertension Mother  Graves' disease Mother    Hyperlipidemia Mother    Heart failure Father    Hypertension Father    Prostate cancer Maternal Uncle     SOCIAL HISTORY:  Social History   Socioeconomic History   Marital status: Single    Spouse name: Not on file   Number of children: Not on file   Years of  education: Not on file   Highest education level: Not on file  Occupational History   Not on file  Tobacco Use   Smoking status: Never   Smokeless tobacco: Never  Vaping Use   Vaping status: Never Used  Substance and Sexual Activity   Alcohol use: Yes    Comment: occ   Drug use: No   Sexual activity: Yes  Other Topics Concern   Not on file  Social History Narrative   Not on file   Social Drivers of Health   Financial Resource Strain: Not on file  Food Insecurity: Not on file  Transportation Needs: Not on file  Physical Activity: Not on file  Stress: Not on file  Social Connections: Not on file  Intimate Partner Violence: Not on file    ALLERGIES: Lisinopril and Crestor [rosuvastatin calcium]  MEDICATIONS:  Current Outpatient Medications  Medication Sig Dispense Refill   allopurinol  (ZYLOPRIM ) 100 MG tablet Take 1 tablet (100 mg total) by mouth daily. 30 tablet 6   AMBULATORY NON FORMULARY MEDICATION Medication Name: whey powder     AMBULATORY NON FORMULARY MEDICATION Medication Name: tumeric     amLODipine  (NORVASC ) 2.5 MG tablet TAKE 1 TABLET BY MOUTH EVERY DAY 90 tablet 3   aspirin 81 MG tablet Take 81 mg by mouth daily.     Cholecalciferol (VITAMIN D3) 2000 units capsule Take 1 capsule (2,000 Units total) by mouth daily. 100 capsule 3   ezetimibe  (ZETIA ) 10 MG tablet TAKE 1 TABLET BY MOUTH EVERY DAY 90 tablet 3   icosapent  Ethyl (VASCEPA ) 1 g capsule TAKE 2 CAPSULES BY MOUTH TWICE A DAY 360 capsule 0   LORazepam  (ATIVAN ) 1 MG tablet Take 0.5-1 tablets (0.5-1 mg total) by mouth 2 (two) times daily as needed for anxiety. 60 tablet 1   MELATONIN PO Take by mouth at bedtime.     methylPREDNISolone  (MEDROL  DOSEPAK) 4 MG TBPK tablet follow package directions 21 tablet 0   pravastatin  (PRAVACHOL ) 20 MG tablet TAKE 1 TABLET BY MOUTH EVERY DAY 90 tablet 3   sildenafil  (VIAGRA ) 100 MG tablet TAKE 1 TABLET BY MOUTH EVERY DAY AS NEEDED FOR ERECTILE DYSFUNCTION 30 tablet 5    triamterene -hydrochlorothiazide (MAXZIDE-25) 37.5-25 MG tablet TAKE 1 TABLET BY MOUTH DAILY. ANNUAL APPT DUE IN AUGUST MUST SEE PROVIDER FOR FUTURE REFILLS 90 tablet 0   No current facility-administered medications for this encounter.      REVIEW OF SYSTEMS:  On review of systems, the patient reports that he is doing well overall. He denies any chest pain, shortness of breath, cough, fevers, chills, night sweats, unintended weight changes. He denies any bowel disturbances, and denies abdominal pain, nausea or vomiting. He denies any new musculoskeletal or joint aches or pains. His IPSS was Total Score: 1, indicating mild urinary symptoms (Reference 0-7 mild, 8-19 moderate, 20-35 severe).  His SHIM: 23, indicating he has no erectile dysfunction (Reference - 22-25 None, 17-21 Mild, 8-16 Moderate, 1-7 Severe). A complete review of systems is obtained and is otherwise negative.     PHYSICAL EXAM:  Wt Readings from Last 3 Encounters:  03/07/23  197 lb (89.4 kg)  12/08/22 193 lb (87.5 kg)  10/14/21 195 lb (88.5 kg)   Temp Readings from Last 3 Encounters:  03/07/23 98 F (36.7 C) (Temporal)  12/08/22 98.2 F (36.8 C) (Oral)  10/14/21 98.6 F (37 C) (Oral)   BP Readings from Last 3 Encounters:  12/08/22 130/80  10/14/21 (!) 140/90  10/01/20 (!) 152/90   Pulse Readings from Last 3 Encounters:  12/08/22 80  10/14/21 85  10/01/20 90   Pain Assessment Pain Score: 0-No pain/10  In general this is a well appearing male in no acute distress. He's alert and oriented x4 and appropriate throughout the examination. Cardiopulmonary assessment is negative for acute distress, and he exhibits normal effort.     KPS = 100  100 - Normal; no complaints; no evidence of disease. 90   - Able to carry on normal activity; minor signs or symptoms of disease. 80   - Normal activity with effort; some signs or symptoms of disease. 31   - Cares for self; unable to carry on normal activity or to do active  work. 60   - Requires occasional assistance, but is able to care for most of his personal needs. 50   - Requires considerable assistance and frequent medical care. 40   - Disabled; requires special care and assistance. 30   - Severely disabled; hospital admission is indicated although death not imminent. 20   - Very sick; hospital admission necessary; active supportive treatment necessary. 10   - Moribund; fatal processes progressing rapidly. 0     - Dead  Karnofsky DA, Abelmann WH, Craver LS and [facility] [phone]) The use of the nitrogen mustards in the palliative treatment of carcinoma: with particular reference to bronchogenic carcinoma Cancer 1 634-56  LABORATORY DATA:  Lab Results  Component Value Date   WBC 4.4 12/08/2022   HGB 15.6 12/08/2022   HCT 46.6 12/08/2022   MCV 91.1 12/08/2022   PLT 265.0 12/08/2022   Lab Results  Component Value Date   NA 133 (L) 12/08/2022   K 4.4 12/08/2022   CL 93 (L) 12/08/2022   CO2 30 12/08/2022   Lab Results  Component Value Date   ALT 98 (H) 12/08/2022   AST 87 (H) 12/08/2022   ALKPHOS 63 12/08/2022   BILITOT 1.4 (H) 12/08/2022     RADIOGRAPHY: No results found.    IMPRESSION/PLAN: 1. 57 y.o. gentleman with Stage T1c adenocarcinoma of the prostate with Gleason Score of 3+4, and PSA of 55. We discussed the patient's workup and outlined the nature of prostate cancer in this setting. The patient's T stage, Gleason's score, and PSA put him into the high risk group. Accordingly, he is eligible for a variety of potential treatment options including LT-ADT with 5.5-8 weeks of external radiation, or LT-ADT with 5 weeks of external radiation with an upfront brachytherapy boost, or prostatectomy. We discussed the available radiation techniques, and focused on the details and logistics of delivery.  We discussed and outlined the risks, benefits, short and long-term effects associated with radiotherapy and compared and contrasted these with  prostatectomy. We discussed the role of SpaceOAR gel in reducing the rectal toxicity associated with radiotherapy. We also detailed the role of ADT in the treatment of high risk prostate cancer and outlined the associated side effects that could be expected with this therapy.  He appears to have a good understanding of his disease and our treatment recommendations which are of curative intent.  He was  encouraged to ask questions that were answered to his stated satisfaction.  The patient asked a lot of insightful questions.  He also asked a lot of questions casting doubt on his high risk status, which is based only PSA.  It is clear in his delayed pursuit of treatment and clear in his questions that he would like to avoid ADT and try to minimize impact of prostate cancer treatment on his quality-of-life.  While this is understandable, I did repeatedly clearly state that I would recommend LT-ADT and definitive radiotherapy with or without brachytherapy boost.  At the conclusion of our conversation, the patient is interested in moving forward with considering his options.  We'll circle back in a few days to try to encourage him to make a treatment decision.  He'll likely need to see Dr. Alvaro at [facility] again regardless of whichever option he pursues to help expedite ADT, and brachy or fiducials with SpaceOAR.  I personally spent 60 minutes in this encounter including chart review, reviewing radiological studies, meeting face-to-face with the patient, entering orders and completing documentation.      Donnice Barge, MD  [facility]  Radiation Oncology Direct Dial: [phone]  Fax: [phone] [email or web address]  Skype  LinkedIn

## 2023-03-10 NOTE — Progress Notes (Signed)
 RN spoke with patient to follow up from recent consult on 12/31 with Dr. Patrcia.   Patient remains undecided with his treatment decision however verbalized he is leaning towards brachytherapy alone. RN went through treatment recommendations of LT-ADT with brachy boost followed by 5 weeks of daily radiation, or LT-ADT with 8 weeks of daily radiation.  Patient has verbalized he is not interested in ADT, and he is aware that per NCCN guidelines it is recommended.  Pt aware that if he does brachytherapy alone it will decrease his curative rate and increase his recurrence rate.  Patient understands.  All questions answered at this time.  Patient and myself will reconnect on Monday 1/6 with hopes to finalize a treatment decision.

## 2023-03-13 NOTE — Progress Notes (Signed)
 RN spoke with patient and after consideration of recommendations patient is wanting to proceed with brachytherapy only.    Patient again is aware of the recommendations of LT-ADT with brachy boost followed by 5 weeks of daily radiation or LT-ADT with 8 weeks of daily radiation.    Patient will be scheduled at [facility] to re-establish care for procedure.  Will continue to follow.

## 2023-03-16 NOTE — Progress Notes (Signed)
 Patient saw urologist, Dr. Cam, today and contacted me to update that he will start ADT once he receives authorization through insurance. RN provided education on treatment recommendations, all questions answered.  Patient remains hesitant with hormonal therapy but will try Orgovyx.  RN will follow up to ensure start date and coordinate for brachy approximately 2 months from start of ADT.

## 2023-03-20 ENCOUNTER — Encounter: Payer: Self-pay | Admitting: Internal Medicine

## 2023-03-23 NOTE — Progress Notes (Signed)
Patient called to notify that he would like to proceed with brachytherapy only.  He is declining ADT at this time, and does not want to have the 5 weeks daily external beam after brachytherapy.   MD's notified.  Plan of care in progress. Will continue to follow.

## 2023-03-28 ENCOUNTER — Telehealth: Payer: Self-pay | Admitting: *Deleted

## 2023-03-28 ENCOUNTER — Other Ambulatory Visit: Payer: Self-pay | Admitting: Urology

## 2023-03-28 NOTE — Telephone Encounter (Signed)
CALLED PATIENT TO INFORM OF PRE-SEED APPTS. FOR 04-27-23 AND HIS IMPLANT ON 05-18-23, SPOKE WITH PATIENT AND HE IS AWARE OF THESE APPTS.

## 2023-04-10 ENCOUNTER — Other Ambulatory Visit: Payer: Self-pay

## 2023-04-10 DIAGNOSIS — C61 Malignant neoplasm of prostate: Secondary | ICD-10-CM

## 2023-04-11 ENCOUNTER — Telehealth: Payer: Self-pay | Admitting: Dietician

## 2023-04-11 NOTE — Telephone Encounter (Signed)
Scheduled appointment per 2/3 scheduling message. Patient is aware of the made appointment and is active on MyChart.

## 2023-04-25 ENCOUNTER — Telehealth: Payer: Self-pay | Admitting: *Deleted

## 2023-04-25 ENCOUNTER — Encounter: Payer: Self-pay | Admitting: Urology

## 2023-04-25 NOTE — Telephone Encounter (Signed)
 CALLED PATIENT TO INFORM OF PRE-SEED APPTS. BEING MOVED ON 04-27-23- ARRIVAL TIME- 10:15 AM @ [facility], SPOKE WITH PATIENT AND HE IS AWARE OF THE APPT. CHANGES AND IS GOOD WITH THE APPT. CHANGES

## 2023-04-25 NOTE — Progress Notes (Signed)
 Pre-seed nursing interview for a diagnosis of Prostate CA (HCC) Unstaged.  Patient identity verified x2.   Patient reports doing well. No issues conveyed at this time.  Meaningful use complete.  Urinary Management medication(s)- None Urology appointment date- 04/2023, with Dr. Marlou Porch at [facility]  Limited via phone- Ht 5\' 7"  (1.702 m)   Wt 188 lb (85.3 kg)   BMI 29.44 kg/m   This concludes the interaction.  Ruel Favors, LPN

## 2023-04-26 ENCOUNTER — Telehealth: Payer: Self-pay | Admitting: *Deleted

## 2023-04-26 NOTE — Telephone Encounter (Signed)
 CALLED PATIENT TO REMIND OF PRE-SEED APPTS. FOR 04-27-23, SPOKE WITH PATIENT AND HE IS AWARE OF THESE APPTS.

## 2023-04-26 NOTE — Progress Notes (Signed)
  Radiation Oncology         [phone]) [phone] ________________________________  Name: NATHANIE OTTLEY MRN: [id number]  Date: 04/27/2023  DOB: [birth year]  SIMULATION AND TREATMENT PLANNING NOTE PUBIC ARCH STUDY  VO:[id number], [name], MD  [name], MD  DIAGNOSIS:  58 y.o. gentleman with Stage T1c adenocarcinoma of the prostate with Gleason Score of 3+4, and PSA of 55.   Oncology History  Prostate CA [facility])  03/15/2021 Cancer Staging   Staging form: Prostate, AJCC 8th Edition - Clinical stage from 03/15/2021: Stage IIIA (cT1c, cN0, cM0, PSA: 55, Grade Group: 2) - Signed by Marcello Fennel, PA-C on 04/26/2023 Histopathologic type: Adenocarcinoma, NOS Stage prefix: Initial diagnosis Prostate specific antigen (PSA) range: 20 or greater Gleason primary pattern: 3 Gleason secondary pattern: 4 Gleason score: 7 Histologic grading system: 5 grade system   04/01/2021 Initial Diagnosis   Prostate CA (HCC)       ICD-10-CM   1. Prostate CA (HCC)  C61       COMPLEX SIMULATION:  The patient presented today for evaluation for possible prostate seed implant. He was brought to the radiation planning suite and placed supine on the CT couch. A 3-dimensional image study set was obtained in upload to the planning computer. There, on each axial slice, I contoured the prostate gland. Then, using three-dimensional radiation planning tools I reconstructed the prostate in view of the structures from the transperineal needle pathway to assess for possible pubic arch interference. In doing so, I did not appreciate any pubic arch interference. Also, the patient's prostate volume was estimated based on the drawn structure. The volume was 40 cc.  Given the pubic arch appearance and prostate volume, patient remains a good candidate to proceed with prostate seed implant. Today, he freely provided informed written consent to proceed.    PLAN: The patient will undergo prostate seed  implant.   ________________________________  Artist Pais. Kathrynn Running, M.D.

## 2023-04-26 NOTE — Progress Notes (Signed)
 Radiation Oncology         [phone]) [phone] ________________________________  Outpatient Follow up- Pre-seed visit  Name: Joseph Livingston MRN: [id number]  Date: 04/27/2023  DOB: 1965/05/13  QM:[id number], [name], MD  [name], MD   REFERRING PHYSICIAN: [name], MD  DIAGNOSIS: 58 y.o. gentleman with Stage T1c adenocarcinoma of the prostate with Gleason score of 3+4, and PSA of 55.     ICD-10-CM   1. Prostate CA (HCC)  C61     2. Malignant neoplasm of prostate [facility])  C61       HISTORY OF PRESENT ILLNESS: Joseph Livingston is a 58 y.o. male  from [place], [place], who presented with a history of prostate-specific antigen (PSA) elevation and subsequent evaluation for prostate malignancy. His PSA was elevated at 28.1 on October 01, 2020, and 28.79 on June 24, 2020. He had previously undergone a prostate biopsy in 2018, which was negative for malignancy.  On March 15, 2021, he underwent a repeat prostate biopsy with Dr. Berneice Heinrich. The prostate volume was estimated at 44 cc, and the biopsy revealed the following findings:  Left Prostate:  Multiple sites (base lateral, mid lateral, apex lateral, base, and mid) showed benign prostate tissue. Left apex lateral: Prostatic adenocarcinoma, Gleason grade 3+3=6/10 (Grade Group 1), involving <1 mm of 16 mm total biopsy length. Right Prostate:  Right base, base lateral, and mid lateral: Prostatic adenocarcinoma, Gleason grade 3+3=6/10 (Grade Group 1), with carcinoma involvement ranging from 3 mm to 5 mm. Right mid, mid lateral, and apex: Prostatic adenocarcinoma, Gleason grade 3+4=7/10 (Grade Group 2), with Gleason pattern 4 constituting 5-10% of carcinoma and carcinoma involvement ranging from 4 mm to 16 mm. Right apex lateral: Atypical small acinar proliferation, suspicious but not diagnostic for malignancy. A PSMA PET scan performed on June 02, 2021, identified subtle focal PSMA activity within the inferior right prostate,  consistent with the primary malignancy. There was no evidence of PSMA-avid pelvic, retroperitoneal, or mesenteric lymphadenopathy. No distant metastases, including visceral or osseous metastatic disease, were identified.   He met with Dr. Rodena Medin at [facility] on 06/16/22 and was recommended to undergo long term ADT with definitive radiation therapy.  The patient was reluctant to proceed and has not received any treatmtent thus far.  His PSA increased further to 55 on 06/17/22 and follow-up PSMA PET scan on 07/26/22 also showed no evidence of any lymph node or metastatic progression.  Prostate MRI on 11/22/22 showed  Right transition zone spanning from base to apex measuring 3.1 x 2.7 x 3.3 cm (volume of 14.99 mL). Noncircumscribed T2 intermediate signal lesion with indistinct margins. Marked diffusion restriction. Matched early postcontrast enhancement. This corresponds with the finding on the prior PSMA PET/CT. PI-RADS 5.   MRI of Prostatic capsule: Lesion 1 demonstrates broad-based capsular abutment  within the right anterolateral apex (series 11, image 18).  So, he saw Dr. Nedra Hai again on 12/27/22 in oncology at [facility] on 01/31/23.  Again, he was advised to consider LT-ADT with IMRT and presented to [place] on 03/07/23 to discuss receiving treatment locally.  The patient reviewed the biopsy results with his urologist and was kindly referred to [place] for discussion of potential radiation treatment options. We initially met the patient on 03/06/24 and he was hesitant to proceed with the recommended LT-ADT concurrent with definitive radiotherapy with or without brachytherapy boost due to concerns regarding impact on quality of life.  Despite extensive counseling, after consiering the recommendations, he has elected to proceed  with brachytherapy and SpaceOAR gel placement for treatment of his disease without ADT or external beam radiotherapy. He is here today for his pre-procedure imaging for planning and to answer any additional  questions he may have about this treatment.   PREVIOUS RADIATION THERAPY: No  PAST MEDICAL HISTORY:  Past Medical History:  Diagnosis Date   Fatty liver    Gout    Hyperlipidemia    Hypertension       PAST SURGICAL HISTORY: Past Surgical History:  Procedure Laterality Date   KNEE SURGERY Right 1983   prostate biopsy      FAMILY HISTORY:  Family History  Problem Relation Age of Onset   Hypertension Mother    Graves' disease Mother    Hyperlipidemia Mother    Heart failure Father    Hypertension Father    Prostate cancer Maternal Uncle     SOCIAL HISTORY:  Social History   Socioeconomic History   Marital status: Single    Spouse name: Not on file   Number of children: Not on file   Years of education: Not on file   Highest education level: Not on file  Occupational History   Not on file  Tobacco Use   Smoking status: Never   Smokeless tobacco: Never  Vaping Use   Vaping status: Never Used  Substance and Sexual Activity   Alcohol use: Yes    Comment: occ   Drug use: No   Sexual activity: Yes  Other Topics Concern   Not on file  Social History Narrative   Not on file   Social Drivers of Health   Financial Resource Strain: Not on file  Food Insecurity: No Food Insecurity (04/25/2023)   Hunger Vital Sign    Worried About Running Out of Food in the Last Year: Never true    Ran Out of Food in the Last Year: Never true  Transportation Needs: No Transportation Needs (04/25/2023)   PRAPARE - Administrator, Civil Service (Medical): No    Lack of Transportation (Non-Medical): No  Physical Activity: Not on file  Stress: Not on file  Social Connections: Not on file  Intimate Partner Violence: Not At Risk (04/25/2023)   Humiliation, Afraid, Rape, and Kick questionnaire    Fear of Current or Ex-Partner: No    Emotionally Abused: No    Physically Abused: No    Sexually Abused: No    ALLERGIES: Lisinopril and Crestor [rosuvastatin  calcium]  MEDICATIONS:  Current Outpatient Medications  Medication Sig Dispense Refill   allopurinol (ZYLOPRIM) 100 MG tablet Take 1 tablet (100 mg total) by mouth daily. (Patient not taking: Reported on 04/25/2023) 30 tablet 6   AMBULATORY NON FORMULARY MEDICATION Medication Name: whey powder     amLODipine (NORVASC) 2.5 MG tablet TAKE 1 TABLET BY MOUTH EVERY DAY 90 tablet 3   aspirin 81 MG tablet Take 81 mg by mouth daily.     Cholecalciferol (VITAMIN D3) 2000 units capsule Take 1 capsule (2,000 Units total) by mouth daily. 100 capsule 3   diphenhydramine-acetaminophen (TYLENOL PM) 25-500 MG TABS tablet Take 1 tablet by mouth at bedtime.     ezetimibe (ZETIA) 10 MG tablet TAKE 1 TABLET BY MOUTH EVERY DAY 90 tablet 3   icosapent Ethyl (VASCEPA) 1 g capsule TAKE 2 CAPSULES BY MOUTH TWICE A DAY (Patient not taking: Reported on 04/25/2023) 360 capsule 0   LORazepam (ATIVAN) 1 MG tablet Take 0.5-1 tablets (0.5-1 mg total) by mouth 2 (two) times  daily as needed for anxiety. (Patient not taking: Reported on 04/25/2023) 60 tablet 1   MELATONIN PO Take 1 tablet by mouth at bedtime as needed (sleep).     methylPREDNISolone (MEDROL DOSEPAK) 4 MG TBPK tablet follow package directions (Patient not taking: Reported on 04/25/2023) 21 tablet 0   pravastatin (PRAVACHOL) 20 MG tablet TAKE 1 TABLET BY MOUTH EVERY DAY (Patient taking differently: Take 40 mg by mouth daily.) 90 tablet 3   sildenafil (VIAGRA) 100 MG tablet TAKE 1 TABLET BY MOUTH EVERY DAY AS NEEDED FOR ERECTILE DYSFUNCTION 30 tablet 5   triamterene-hydrochlorothiazide (MAXZIDE-25) 37.5-25 MG tablet TAKE 1 TABLET BY MOUTH DAILY. ANNUAL APPT DUE IN AUGUST MUST SEE PROVIDER FOR FUTURE REFILLS 90 tablet 0   No current facility-administered medications for this encounter.    REVIEW OF SYSTEMS:  On review of systems, the patient reports that he is doing well overall. He denies any chest pain, shortness of breath, cough, fevers, chills, night sweats,  unintended weight changes. He denies any bowel disturbances, and denies abdominal pain, nausea or vomiting. He denies any new musculoskeletal or joint aches or pains. His IPSS was Total Score: 1, indicating mild urinary symptoms (Reference 0-7 mild, 8-19 moderate, 20-35 severe).  His SHIM: 23, indicating he has no erectile dysfunction (Reference - 22-25 None, 17-21 Mild, 8-16 Moderate, 1-7 Severe). A complete review of systems is obtained and is otherwise negative.     PHYSICAL EXAM:  Wt Readings from Last 3 Encounters:  04/25/23 188 lb (85.3 kg)  03/07/23 197 lb (89.4 kg)  12/08/22 193 lb (87.5 kg)   Temp Readings from Last 3 Encounters:  03/07/23 98 F (36.7 C) (Temporal)  12/08/22 98.2 F (36.8 C) (Oral)  10/14/21 98.6 F (37 C) (Oral)   BP Readings from Last 3 Encounters:  12/08/22 130/80  10/14/21 (!) 140/90  10/01/20 (!) 152/90   Pulse Readings from Last 3 Encounters:  12/08/22 80  10/14/21 85  10/01/20 90   Pain Assessment Pain Score: 0-No pain/10  In general this is a well appearing African American male in no acute distress. He's alert and oriented x4 and appropriate throughout the examination. Cardiopulmonary assessment is negative for acute distress, and he exhibits normal effort.     KPS = 100  100 - Normal; no complaints; no evidence of disease. 90   - Able to carry on normal activity; minor signs or symptoms of disease. 80   - Normal activity with effort; some signs or symptoms of disease. 3   - Cares for self; unable to carry on normal activity or to do active work. 60   - Requires occasional assistance, but is able to care for most of his personal needs. 50   - Requires considerable assistance and frequent medical care. 40   - Disabled; requires special care and assistance. 30   - Severely disabled; hospital admission is indicated although death not imminent. 20   - Very sick; hospital admission necessary; active supportive treatment necessary. 10   -  Moribund; fatal processes progressing rapidly. 0     - Dead  Karnofsky DA, Abelmann WH, Craver LS and Burchenal [facility] [phone]) The use of the nitrogen mustards in the palliative treatment of carcinoma: with particular reference to bronchogenic carcinoma Cancer 1 634-56  LABORATORY DATA:  Lab Results  Component Value Date   WBC 4.4 12/08/2022   HGB 15.6 12/08/2022   HCT 46.6 12/08/2022   MCV 91.1 12/08/2022   PLT 265.0 12/08/2022  Lab Results  Component Value Date   NA 133 (L) 12/08/2022   K 4.4 12/08/2022   CL 93 (L) 12/08/2022   CO2 30 12/08/2022   Lab Results  Component Value Date   ALT 98 (H) 12/08/2022   AST 87 (H) 12/08/2022   ALKPHOS 63 12/08/2022   BILITOT 1.4 (H) 12/08/2022     RADIOGRAPHY: No results found.    IMPRESSION/PLAN: 1. 58 y.o. gentleman with Stage T1c adenocarcinoma of the prostate with Gleason score of 3+4, and PSA of 55.  Despite extensive counseling regarding the recommendation for ADT concurrent with brachytherapy boost followed by IMRT, he has elected to proceed with brachytherapy and SpaceOAR gel placement for treatment of his disease without ADT or external beam radiotherapy. We reviewed the risks, benefits, short and long-term effects associated with brachytherapy and discussed the role of SpaceOAR in reducing the rectal toxicity associated with radiotherapy.  He appears to have a good understanding of his disease and our treatment recommendations which are of curative intent.  He was encouraged to ask questions that were answered to his stated satisfaction. He has freely signed written consent to proceed today in the office and a copy of this document will be placed in his medical record. His procedure is tentatively scheduled for 05/18/23 in collaboration with Dr. Marlou Porch and we will see him back for his post-procedure visit approximately 3 weeks thereafter. We look forward to continuing to participate in his care. He knows that he is welcome to call with any  questions or concerns at any time in the interim.  I personally spent 30 minutes in this encounter including chart review, reviewing radiological studies, meeting face-to-face with the patient, entering orders and completing documentation.    Marguarite Arbour, MMS, PA-C [facility] Radiation Oncology Physician Assistant Direct Dial: [phone]  Fax: [phone]

## 2023-04-27 ENCOUNTER — Encounter: Payer: Self-pay | Admitting: Nutrition

## 2023-04-27 ENCOUNTER — Encounter (HOSPITAL_COMMUNITY): Admission: RE | Admit: 2023-04-27 | Payer: 59 | Source: Ambulatory Visit

## 2023-04-27 ENCOUNTER — Encounter: Payer: Self-pay | Admitting: Urology

## 2023-04-27 ENCOUNTER — Ambulatory Visit
Admission: RE | Admit: 2023-04-27 | Discharge: 2023-04-27 | Disposition: A | Discharge status: Home / Self Care | Payer: 59 | Source: Ambulatory Visit | Attending: Radiation Oncology | Admitting: Radiation Oncology

## 2023-04-27 ENCOUNTER — Ambulatory Visit
Admission: RE | Admit: 2023-04-27 | Discharge: 2023-04-27 | Disposition: A | Discharge status: Home / Self Care | Payer: 59 | Source: Ambulatory Visit | Attending: Urology | Admitting: Urology

## 2023-04-27 ENCOUNTER — Inpatient Hospital Stay: Payer: 59 | Attending: Radiation Oncology | Admitting: Nutrition

## 2023-04-27 VITALS — Ht 67.0 in | Wt 190.0 lb

## 2023-04-27 DIAGNOSIS — C61 Malignant neoplasm of prostate: Secondary | ICD-10-CM

## 2023-04-27 NOTE — Progress Notes (Addendum)
 Patient has confirmed that he would like to proceed with brachytherapy as a monotherapy despite extensive education on recommendations for ADT and brachytherapy boost with 5 weeks IMRT.  Patient verbalized understanding and feels very well informed of recommendations.

## 2023-04-27 NOTE — Progress Notes (Signed)
 Patient did not show up for nutrition appointment.

## 2023-05-01 ENCOUNTER — Telehealth: Payer: Self-pay | Admitting: Nutrition

## 2023-05-01 NOTE — Telephone Encounter (Signed)
 Marland Kitchen

## 2023-05-02 NOTE — Progress Notes (Signed)
 COVID Vaccine Completed: yes  Date of COVID positive in last 16 days:no  PCP - Jacinta Shoe, MD LOV 12/08/22 Cardiologist - n/a  Chest x-ray - n/a EKG - 05/03/23 Epic/chart Stress Test - 2017, with Dr. Jacinto Halim ECHO - 2017 per pt, Dr. Jacinto Halim Cardiac Cath - n/a Pacemaker/ICD device last checked: n/a Spinal Cord Stimulator: n/a  Bowel Prep - fleet enema. Patient has instructions   Sleep Study - n/a CPAP -   Fasting Blood Sugar - n/a Checks Blood Sugar _____ times a day  Last dose of GLP1 agonist-  N/A GLP1 instructions:  Hold 7 days before surgery    Last dose of SGLT-2 inhibitors-  N/A SGLT-2 instructions:  Hold 3 days before surgery    Blood Thinner Instructions:  Last dose:   Time: Aspirin Instructions: ASA 81, hold 12 days Last Dose:  Activity level: Can go up a flight of stairs and perform activities of daily living without stopping and without symptoms of chest pain or shortness of breath.   Anesthesia review: BP 168/112, 173/103 and 168/102. Patient denies symptoms. He states he gets white coat syndrome. HE took his medications this morning. Will obtain BMP, CBC, and EKG. Instructed patient tot monitor at home and reach out to PCP if still high or he starts having symptoms.  Patient denies shortness of breath, fever, cough and chest pain at PAT appointment  Patient verbalized understanding of instructions that were given to them at the PAT appointment. Patient was also instructed that they will need to review over the PAT instructions again at home before surgery.

## 2023-05-02 NOTE — Patient Instructions (Signed)
 SURGICAL WAITING ROOM VISITATION  Patients having surgery or a procedure may have no more than 2 support people in the waiting area - these visitors may rotate.    Children under the age of 4 must have an adult with them who is not the patient.  Due to an increase in RSV and influenza rates and associated hospitalizations, children ages 43 and under may not visit patients in [facility] hospitals.  Visitors with respiratory illnesses are discouraged from visiting and should remain at home.  If the patient needs to stay at the hospital during part of their recovery, the visitor guidelines for inpatient rooms apply. Pre-op nurse will coordinate an appropriate time for 1 support person to accompany patient in pre-op.  This support person may not rotate.    Please refer to the [facility] website for the visitor guidelines for Inpatients (after your surgery is over and you are in a regular room).    Your procedure is scheduled on: 05/18/23   Report to [facility] Main Entrance    Report to admitting at 7:15 AM   Call this number if you have problems the morning of surgery [phone]   Do not eat food or drink liquids :After Midnight.          If you have questions, please contact your surgeon's office.   FOLLOW BOWEL PREP AND ANY ADDITIONAL PRE OP INSTRUCTIONS YOU RECEIVED FROM YOUR SURGEON'S OFFICE!!!     Oral Hygiene is also important to reduce your risk of infection.                                    Remember - BRUSH YOUR TEETH THE MORNING OF SURGERY WITH YOUR REGULAR TOOTHPASTE  DENTURES WILL BE REMOVED PRIOR TO SURGERY PLEASE DO NOT APPLY "Poly grip" OR ADHESIVES!!!   Stop all vitamins and herbal supplements 7 days before surgery.   Take these medicines the morning of surgery with A SIP OF WATER: Amlodipine, Zetia             You may not have any metal on your body including jewelry, and body piercing             Do not wear lotions, powders, cologne, or  deodorant              Men may shave face and neck.   Do not bring valuables to the hospital. [facility] IS NOT             RESPONSIBLE   FOR VALUABLES.   Contacts, glasses, dentures or bridgework may not be worn into surgery.  DO NOT BRING YOUR HOME MEDICATIONS TO THE HOSPITAL. PHARMACY WILL DISPENSE MEDICATIONS LISTED ON YOUR MEDICATION LIST TO YOU DURING YOUR ADMISSION IN THE HOSPITAL!    Patients discharged on the day of surgery will not be allowed to drive home.  Someone NEEDS to stay with you for the first 24 hours after anesthesia.              Please read over the following fact sheets you were given: IF YOU HAVE QUESTIONS ABOUT YOUR PRE-OP INSTRUCTIONS PLEASE CALL [name]    If you received a COVID test during your pre-op visit  it is requested that you wear a mask when out in public, stay away from anyone that may not be feeling well and notify your surgeon if you develop symptoms.  If you test positive for Covid or have been in contact with anyone that has tested positive in the last 10 days please notify you surgeon.    [facility] - Preparing for Surgery Before surgery, you can play an important role.  Because skin is not sterile, your skin needs to be as free of germs as possible.  You can reduce the number of germs on your skin by washing with CHG (chlorahexidine gluconate) soap before surgery.  CHG is an antiseptic cleaner which kills germs and bonds with the skin to continue killing germs even after washing. Please DO NOT use if you have an allergy to CHG or antibacterial soaps.  If your skin becomes reddened/irritated stop using the CHG and inform your nurse when you arrive at Short Stay. Do not shave (including legs and underarms) for at least 48 hours prior to the first CHG shower.  You may shave your face/neck.  Please follow these instructions carefully:  1.  Shower with CHG Soap the night before surgery and the  morning of surgery.  2.  If you choose to  wash your hair, wash your hair first as usual with your normal  shampoo.  3.  After you shampoo, rinse your hair and body thoroughly to remove the shampoo.                             4.  Use CHG as you would any other liquid soap.  You can apply chg directly to the skin and wash.  Gently with a scrungie or clean washcloth.  5.  Apply the CHG Soap to your body ONLY FROM THE NECK DOWN.   Do   not use on face/ open                           Wound or open sores. Avoid contact with eyes, ears mouth and   genitals (private parts).                       Wash face,  Genitals (private parts) with your normal soap.             6.  Wash thoroughly, paying special attention to the area where your    surgery  will be performed.  7.  Thoroughly rinse your body with warm water from the neck down.  8.  DO NOT shower/wash with your normal soap after using and rinsing off the CHG Soap.                9.  Pat yourself dry with a clean towel.            10.  Wear clean pajamas.            11.  Place clean sheets on your bed the night of your first shower and do not  sleep with pets. Day of Surgery : Do not apply any lotions/deodorants the morning of surgery.  Please wear clean clothes to the hospital/surgery center.  FAILURE TO FOLLOW THESE INSTRUCTIONS MAY RESULT IN THE CANCELLATION OF YOUR SURGERY  PATIENT SIGNATURE_________________________________  NURSE SIGNATURE__________________________________  ________________________________________________________________________

## 2023-05-03 ENCOUNTER — Encounter (HOSPITAL_COMMUNITY)
Admission: RE | Admit: 2023-05-03 | Discharge: 2023-05-03 | Disposition: A | Discharge status: Home / Self Care | Payer: 59 | Source: Ambulatory Visit | Attending: Urology | Admitting: Urology

## 2023-05-03 ENCOUNTER — Encounter (HOSPITAL_COMMUNITY): Payer: Self-pay

## 2023-05-03 ENCOUNTER — Other Ambulatory Visit: Payer: Self-pay

## 2023-05-03 VITALS — BP 168/102 | HR 91 | Temp 98.5°F | Resp 16 | Ht 67.0 in | Wt 189.0 lb

## 2023-05-03 DIAGNOSIS — Z01818 Encounter for other preprocedural examination: Secondary | ICD-10-CM | POA: Insufficient documentation

## 2023-05-03 DIAGNOSIS — I2583 Coronary atherosclerosis due to lipid rich plaque: Secondary | ICD-10-CM | POA: Diagnosis not present

## 2023-05-03 DIAGNOSIS — I2510 Atherosclerotic heart disease of native coronary artery without angina pectoris: Secondary | ICD-10-CM | POA: Insufficient documentation

## 2023-05-03 DIAGNOSIS — Z01812 Encounter for preprocedural laboratory examination: Secondary | ICD-10-CM | POA: Diagnosis present

## 2023-05-03 DIAGNOSIS — Z01810 Encounter for preprocedural cardiovascular examination: Secondary | ICD-10-CM | POA: Diagnosis present

## 2023-05-03 HISTORY — DX: Family history of other specified conditions: Z84.89

## 2023-05-03 LAB — CBC
HCT: 44.2 % (ref 39.0–52.0)
Hemoglobin: 15.3 g/dL (ref 13.0–17.0)
MCH: 30.5 pg (ref 26.0–34.0)
MCHC: 34.6 g/dL (ref 30.0–36.0)
MCV: 88.2 fL (ref 80.0–100.0)
Platelets: 259 10*3/uL (ref 150–400)
RBC: 5.01 MIL/uL (ref 4.22–5.81)
RDW: 12.1 % (ref 11.5–15.5)
WBC: 4.8 10*3/uL (ref 4.0–10.5)
nRBC: 0 % (ref 0.0–0.2)

## 2023-05-03 LAB — BASIC METABOLIC PANEL
Anion gap: 13 (ref 5–15)
BUN: 18 mg/dL (ref 6–20)
CO2: 25 mmol/L (ref 22–32)
Calcium: 10.2 mg/dL (ref 8.9–10.3)
Chloride: 95 mmol/L — ABNORMAL LOW (ref 98–111)
Creatinine, Ser: 0.96 mg/dL (ref 0.61–1.24)
GFR, Estimated: >60 mL/min (ref >60)
Glucose, Bld: 106 mg/dL — ABNORMAL HIGH (ref 70–99)
Potassium: 3.8 mmol/L (ref 3.5–5.1)
Sodium: 133 mmol/L — ABNORMAL LOW (ref 135–145)

## 2023-05-04 ENCOUNTER — Encounter: Payer: Self-pay | Admitting: Internal Medicine

## 2023-05-08 NOTE — Progress Notes (Signed)
 Anesthesia Chart Review   Case: [id number] Date/Time: 05/18/23 0915   Procedures:      RADIOACTIVE SEED IMPLANT/BRACHYTHERAPY IMPLANT - 90 MINUTE CASE     SPACE OAR INSTILLATION   Anesthesia type: General   Pre-op diagnosis: PROSTATE CANCER   Location: [facility] PROCEDURE RO[facility] / [facility]   Surgeons: Crist Fat, MD       DISCUSSION:58 y.o. never smoker with h/o HTN, prostate cancer scheduled for above procedure 05/18/2023 with Dr. Berniece Salines.   Elevated BP at PAT visit.  Pt reports he has white coat syndrome.  Pt asymptomatic, EKG with no acute findings. He was advised to continue monitoring at home and contact PCP if BP remains elevated.  Discussed risk of cancellation DOS.    BP Readings from Last 3 Encounters:  05/03/23 (!) 168/102  12/08/22 130/80  10/14/21 (!) 140/90    VS: There were no vitals taken for this visit.  PROVIDERS: [name], [name], MD is PCP    LABS: Labs reviewed: Acceptable for surgery. (all labs ordered are listed, but only abnormal results are displayed)  Labs Reviewed - No data to display   IMAGES:   EKG:   CV:  Past Medical History:  Diagnosis Date   Family history of adverse reaction to anesthesia    mother PONV   Fatty liver    Gout    Hyperlipidemia    Hypertension     Past Surgical History:  Procedure Laterality Date   KNEE SURGERY Right 1983   prostate biopsy     x2   WISDOM TOOTH EXTRACTION  1999    MEDICATIONS: No current facility-administered medications for this encounter.    amLODipine (NORVASC) 2.5 MG tablet   aspirin 81 MG tablet   Cholecalciferol (VITAMIN D3) 2000 units capsule   diphenhydramine-acetaminophen (TYLENOL PM) 25-500 MG TABS tablet   ezetimibe (ZETIA) 10 MG tablet   MELATONIN PO   pravastatin (PRAVACHOL) 20 MG tablet   triamterene-hydrochlorothiazide (MAXZIDE-25) 37.5-25 MG tablet   allopurinol (ZYLOPRIM) 100 MG tablet   AMBULATORY NON FORMULARY MEDICATION   icosapent Ethyl (VASCEPA) 1 g  capsule   LORazepam (ATIVAN) 1 MG tablet   methylPREDNISolone (MEDROL DOSEPAK) 4 MG TBPK tablet   sildenafil (VIAGRA) 100 MG tablet     [facility], PA-C [facility] Pre-Surgical Testing [phone]

## 2023-05-11 ENCOUNTER — Inpatient Hospital Stay: Payer: 59 | Attending: Radiation Oncology | Admitting: Nutrition

## 2023-05-11 NOTE — Progress Notes (Signed)
 59 year old male diagnosed with prostate cancer.  Patient is followed by Dr. Kathrynn Running and received brachytherapy.  Past medical history includes fatty liver, gout, hyperlipidemia and hypertension.  Medications include whey protein, turmeric, vitamin D, Ativan.  Labs include sodium 133, chloride 95, glucose 106.  Height: 5 feet 7 inches. Weight: 189 pounds on February 26. Usual body weight: 190-200 pounds. BMI: 29.6.  Patient requesting information regarding healthy diet for prostate cancer survival.  Nutrition diagnosis: Food and nutrition related knowledge deficit related to prostate cancer and associated treatments as evidenced by no prior need for nutrition related information.  Intervention: Educated on healthy plant-based diet with increased vegetables, fruits, and whole grains. Encouraged healthy fats and limiting simple sugars. Discussed adequate hydration of approximately 64 ounces water daily. Provided handout on plant-based diet, health and wellness: Living with prostate cancer, eating well for optimal health, and the size of Living Well, beyond cancer. Questions answered.  Contact information given.  Monitoring evaluation: Patient will tolerate healthy plant-based diet to minimize risk for recurrence.  No follow-up scheduled.  **Disclaimer: This note was dictated with voice recognition software. Similar sounding words can inadvertently be transcribed and this note may contain transcription errors which may not have been corrected upon publication of note.**

## 2023-05-15 ENCOUNTER — Telehealth: Payer: Self-pay | Admitting: *Deleted

## 2023-05-15 NOTE — Telephone Encounter (Signed)
"  Joseph Livingston, [phone] (home) calling to have FMLA paperwork completed tomorrow.  Surgery scheduled Thursday, 05/18/2023.  Navigator gave me your number." Advised to have surgeon complete paperwork.  [facility] unable to supply surgical information.   "I work for [employer].  Spoke with the nurse who needs information from you all with the dates I will be out of work." Advised if radiation has agreed to certify time out for surgery we need form in office, complete a [facility] HIPAA Authorization and allow 7-10 business day processing time frame.  If a letter is  needed, the navigator or radiation oncologist nurse can prepare a letter.Marland Kitchen   "I will call the nurse in office and find out."

## 2023-05-17 ENCOUNTER — Other Ambulatory Visit: Payer: Self-pay | Admitting: Internal Medicine

## 2023-05-17 ENCOUNTER — Telehealth: Payer: Self-pay | Admitting: *Deleted

## 2023-05-17 NOTE — Telephone Encounter (Signed)
 Called patient to remind of procedure for 05-18-23, spoke with patient and he is aware of this procedure

## 2023-05-18 ENCOUNTER — Other Ambulatory Visit: Payer: Self-pay

## 2023-05-18 ENCOUNTER — Ambulatory Visit (HOSPITAL_COMMUNITY)

## 2023-05-18 ENCOUNTER — Ambulatory Visit (HOSPITAL_COMMUNITY)
Admission: RE | Admit: 2023-05-18 | Discharge: 2023-05-18 | Disposition: A | Discharge status: Home / Self Care | Payer: 59 | Attending: Urology | Admitting: Urology

## 2023-05-18 ENCOUNTER — Encounter (HOSPITAL_COMMUNITY): Payer: Self-pay | Admitting: Urology

## 2023-05-18 ENCOUNTER — Encounter (HOSPITAL_COMMUNITY)
Admission: RE | Disposition: A | Discharge status: Home / Self Care | Payer: Self-pay | Source: Home / Self Care | Attending: Urology

## 2023-05-18 ENCOUNTER — Ambulatory Visit (HOSPITAL_BASED_OUTPATIENT_CLINIC_OR_DEPARTMENT_OTHER): Payer: Self-pay | Admitting: Physician Assistant

## 2023-05-18 ENCOUNTER — Ambulatory Visit (HOSPITAL_COMMUNITY): Payer: Self-pay | Admitting: Physician Assistant

## 2023-05-18 DIAGNOSIS — C61 Malignant neoplasm of prostate: Secondary | ICD-10-CM | POA: Insufficient documentation

## 2023-05-18 DIAGNOSIS — I10 Essential (primary) hypertension: Secondary | ICD-10-CM | POA: Diagnosis not present

## 2023-05-18 HISTORY — PX: SPACE OAR INSTILLATION: SHX6769

## 2023-05-18 HISTORY — PX: RADIOACTIVE SEED IMPLANT: SHX5150

## 2023-05-18 SURGERY — INSERTION, RADIATION SOURCE, PROSTATE
Anesthesia: General

## 2023-05-18 MED ORDER — EPHEDRINE 5 MG/ML INJ
INTRAVENOUS | Status: AC
  Filled 2023-05-18: qty 10

## 2023-05-18 MED ORDER — SODIUM CHLORIDE FLUSH 0.9 % IV SOLN
INTRAVENOUS | Status: DC | PRN
  Administered 2023-05-18: 10 mL

## 2023-05-18 MED ORDER — LABETALOL HCL 5 MG/ML IV SOLN
INTRAVENOUS | Status: AC
  Filled 2023-05-18: qty 4

## 2023-05-18 MED ORDER — FLEET ENEMA RE ENEM: 1.0000 | ENEMA | Freq: Once | RECTAL | Status: DC

## 2023-05-18 MED ORDER — LACTATED RINGERS IV SOLN: INTRAVENOUS | Status: DC

## 2023-05-18 MED ORDER — LABETALOL HCL 5 MG/ML IV SOLN
10.0000 mg | INTRAVENOUS | Status: DC | PRN
  Administered 2023-05-18: 10 mg via INTRAVENOUS

## 2023-05-18 MED ORDER — SUGAMMADEX SODIUM 200 MG/2ML IV SOLN
INTRAVENOUS | Status: DC | PRN
  Administered 2023-05-18: 200 mg via INTRAVENOUS

## 2023-05-18 MED ORDER — FENTANYL CITRATE (PF) 100 MCG/2ML IJ SOLN
INTRAMUSCULAR | Status: DC | PRN
  Administered 2023-05-18 (×3): 50 ug via INTRAVENOUS
  Administered 2023-05-18: 100 ug via INTRAVENOUS
  Administered 2023-05-18: 50 ug via INTRAVENOUS

## 2023-05-18 MED ORDER — SUGAMMADEX SODIUM 200 MG/2ML IV SOLN
INTRAVENOUS | Status: AC
  Filled 2023-05-18: qty 2

## 2023-05-18 MED ORDER — ONDANSETRON HCL 4 MG/2ML IJ SOLN
INTRAMUSCULAR | Status: AC
  Filled 2023-05-18: qty 2

## 2023-05-18 MED ORDER — ROCURONIUM BROMIDE 10 MG/ML (PF) SYRINGE
PREFILLED_SYRINGE | INTRAVENOUS | Status: AC
  Filled 2023-05-18: qty 10

## 2023-05-18 MED ORDER — MIDAZOLAM HCL 2 MG/2ML IJ SOLN
INTRAMUSCULAR | Status: DC | PRN
  Administered 2023-05-18: 2 mg via INTRAVENOUS

## 2023-05-18 MED ORDER — ONDANSETRON HCL 4 MG/2ML IJ SOLN
INTRAMUSCULAR | Status: DC | PRN
  Administered 2023-05-18: 4 mg via INTRAVENOUS

## 2023-05-18 MED ORDER — PROPOFOL 10 MG/ML IV BOLUS
INTRAVENOUS | Status: DC | PRN
  Administered 2023-05-18: 200 mg via INTRAVENOUS

## 2023-05-18 MED ORDER — PHENYLEPHRINE 80 MCG/ML (10ML) SYRINGE FOR IV PUSH (FOR BLOOD PRESSURE SUPPORT)
PREFILLED_SYRINGE | INTRAVENOUS | Status: AC
  Filled 2023-05-18: qty 10

## 2023-05-18 MED ORDER — SODIUM CHLORIDE (PF) 0.9 % IJ SOLN
INTRAMUSCULAR | Status: AC
  Filled 2023-05-18: qty 20

## 2023-05-18 MED ORDER — FENTANYL CITRATE (PF) 100 MCG/2ML IJ SOLN
INTRAMUSCULAR | Status: AC
  Filled 2023-05-18: qty 2

## 2023-05-18 MED ORDER — TAMSULOSIN HCL 0.4 MG PO CAPS: 0.4000 mg | ORAL_CAPSULE | Freq: Every day | ORAL | 0 refills | Status: DC

## 2023-05-18 MED ORDER — DEXAMETHASONE SODIUM PHOSPHATE 10 MG/ML IJ SOLN
INTRAMUSCULAR | Status: AC
  Filled 2023-05-18: qty 1

## 2023-05-18 MED ORDER — OXYCODONE HCL 5 MG PO TABS
ORAL_TABLET | ORAL | Status: AC
  Filled 2023-05-18: qty 1

## 2023-05-18 MED ORDER — IOHEXOL 300 MG/ML  SOLN
INTRAMUSCULAR | Status: DC | PRN
  Administered 2023-05-18: 10 mL

## 2023-05-18 MED ORDER — ONDANSETRON HCL 4 MG/2ML IJ SOLN: 4.0000 mg | Freq: Four times a day (QID) | INTRAMUSCULAR | Status: DC | PRN

## 2023-05-18 MED ORDER — FENTANYL CITRATE (PF) 100 MCG/2ML IJ SOLN
INTRAMUSCULAR | Status: AC
Start: 2023-05-18 — End: ?
  Filled 2023-05-18: qty 2

## 2023-05-18 MED ORDER — LIDOCAINE HCL (PF) 2 % IJ SOLN
INTRAMUSCULAR | Status: AC
  Filled 2023-05-18: qty 5

## 2023-05-18 MED ORDER — CIPROFLOXACIN IN D5W 400 MG/200ML IV SOLN
400.0000 mg | INTRAVENOUS | Status: AC
  Administered 2023-05-18: 400 mg via INTRAVENOUS
  Filled 2023-05-18: qty 200

## 2023-05-18 MED ORDER — ROCURONIUM BROMIDE 10 MG/ML (PF) SYRINGE
PREFILLED_SYRINGE | INTRAVENOUS | Status: DC | PRN
  Administered 2023-05-18: 50 mg via INTRAVENOUS
  Administered 2023-05-18: 10 mg via INTRAVENOUS

## 2023-05-18 MED ORDER — SODIUM CHLORIDE 0.9 % IR SOLN
Status: DC | PRN
  Administered 2023-05-18: 3000 mL via INTRAVESICAL

## 2023-05-18 MED ORDER — LIDOCAINE HCL (PF) 2 % IJ SOLN
INTRAMUSCULAR | Status: DC | PRN
  Administered 2023-05-18: 60 mg via INTRADERMAL

## 2023-05-18 MED ORDER — ORAL CARE MOUTH RINSE: 15.0000 mL | Freq: Once | OROMUCOSAL | Status: AC

## 2023-05-18 MED ORDER — OXYCODONE HCL 5 MG/5ML PO SOLN: 5.0000 mg | Freq: Once | ORAL | Status: AC | PRN

## 2023-05-18 MED ORDER — OXYCODONE HCL 5 MG PO TABS
5.0000 mg | ORAL_TABLET | Freq: Once | ORAL | Status: AC | PRN
  Administered 2023-05-18: 5 mg via ORAL

## 2023-05-18 MED ORDER — FENTANYL CITRATE PF 50 MCG/ML IJ SOSY: 25.0000 ug | PREFILLED_SYRINGE | INTRAMUSCULAR | Status: DC | PRN

## 2023-05-18 MED ORDER — MIDAZOLAM HCL 2 MG/2ML IJ SOLN
INTRAMUSCULAR | Status: AC
  Filled 2023-05-18: qty 2

## 2023-05-18 MED ORDER — TRAMADOL HCL 50 MG PO TABS: 50.0000 mg | ORAL_TABLET | Freq: Four times a day (QID) | ORAL | 0 refills | Status: DC | PRN

## 2023-05-18 MED ORDER — PHENYLEPHRINE 80 MCG/ML (10ML) SYRINGE FOR IV PUSH (FOR BLOOD PRESSURE SUPPORT)
PREFILLED_SYRINGE | INTRAVENOUS | Status: DC | PRN
Start: 2023-05-18 — End: 2023-05-18
  Administered 2023-05-18 (×2): 80 ug via INTRAVENOUS
  Administered 2023-05-18 (×2): 40 ug via INTRAVENOUS

## 2023-05-18 MED ORDER — CHLORHEXIDINE GLUCONATE 0.12 % MT SOLN
15.0000 mL | Freq: Once | OROMUCOSAL | Status: AC
  Administered 2023-05-18: 15 mL via OROMUCOSAL

## 2023-05-18 MED ORDER — PROPOFOL 10 MG/ML IV BOLUS
INTRAVENOUS | Status: AC
  Filled 2023-05-18: qty 20

## 2023-05-18 MED ORDER — LIDOCAINE 2% (20 MG/ML) 5 ML SYRINGE: INTRAMUSCULAR | Status: DC | PRN

## 2023-05-18 MED ORDER — DEXAMETHASONE SODIUM PHOSPHATE 10 MG/ML IJ SOLN
INTRAMUSCULAR | Status: DC | PRN
  Administered 2023-05-18: 8 mg via INTRAVENOUS

## 2023-05-18 SURGICAL SUPPLY — 33 items
BAG COUNTER SPONGE SURGICOUNT (BAG) IMPLANT
CATH FOLEY 2WAY SLVR 5CC 16FR (CATHETERS) ×2 IMPLANT
CATH ROBINSON RED A/P 20FR (CATHETERS) ×1 IMPLANT
COVER BACK TABLE 60X90IN (DRAPES) ×1 IMPLANT
COVER MAYO STAND STRL (DRAPES) ×1 IMPLANT
COVER SURGICAL LIGHT HANDLE (MISCELLANEOUS) ×1 IMPLANT
DRAPE U-SHAPE 47X51 STRL (DRAPES) ×1 IMPLANT
DRSG TEGADERM 4X4.75 (GAUZE/BANDAGES/DRESSINGS) ×2 IMPLANT
DRSG TEGADERM 8X12 (GAUZE/BANDAGES/DRESSINGS) ×2 IMPLANT
GLOVE BIO SURGEON STRL SZ7.5 (GLOVE) ×1 IMPLANT
GLOVE ECLIPSE 8.0 STRL XLNG CF (GLOVE) ×1 IMPLANT
GLOVE SURG LX STRL 7.5 STRW (GLOVE) ×2 IMPLANT
GOWN STRL REUS W/ TWL LRG LVL3 (GOWN DISPOSABLE) ×2 IMPLANT
GRID BRACH TEMP 18GA 2.8X3X.75 (MISCELLANEOUS) IMPLANT
HOLDER FOLEY CATH W/STRAP (MISCELLANEOUS) ×1 IMPLANT
I-125 Implant Seed IMPLANT
IMPL SPACEOAR SYSTEM 10ML (Spacer) ×1 IMPLANT
IMPLANT SPACEOAR SYSTEM 10ML (Spacer) ×1 IMPLANT
KIT TURNOVER KIT A (KITS) IMPLANT
MARKER SKIN DUAL TIP RULER LAB (MISCELLANEOUS) ×1 IMPLANT
NDL BRACHY 18G 5PK (NEEDLE) ×1 IMPLANT
NDL BRACHY 18G SINGLE (NEEDLE) ×1 IMPLANT
NDL PK MORGANSTERN STABILIZ (NEEDLE) IMPLANT
NEEDLE BRACHY 18G 5PK (NEEDLE) ×4 IMPLANT
NEEDLE BRACHY 18G SINGLE (NEEDLE) ×2 IMPLANT
NEEDLE PK MORGANSTERN STABILIZ (NEEDLE) ×1 IMPLANT
PACK CYSTO (CUSTOM PROCEDURE TRAY) ×1 IMPLANT
PENCIL SMOKE EVACUATOR (MISCELLANEOUS) IMPLANT
SURGILUBE 2OZ TUBE FLIPTOP (MISCELLANEOUS) ×1 IMPLANT
SYR 10ML LL (SYRINGE) ×1 IMPLANT
TOWEL OR 17X26 10 PK STRL BLUE (TOWEL DISPOSABLE) ×1 IMPLANT
TRAY FOLEY MTR SLVR 16FR STAT (SET/KITS/TRAYS/PACK) IMPLANT
UNDERPAD 30X36 HEAVY ABSORB (UNDERPADS AND DIAPERS) ×2 IMPLANT

## 2023-05-18 NOTE — Anesthesia Procedure Notes (Signed)
 Procedure Name: Intubation Date/Time: 05/18/2023 9:59 AM  Performed by: Sindy Guadeloupe, CRNAPre-anesthesia Checklist: Patient identified, Emergency Drugs available, Suction available, Patient being monitored and Timeout performed Patient Re-evaluated:Patient Re-evaluated prior to induction Oxygen Delivery Method: Circle system utilized Preoxygenation: Pre-oxygenation with 100% oxygen Induction Type: IV induction Ventilation: Mask ventilation without difficulty Laryngoscope Size: Mac and 4 Grade View: Grade II Tube type: Oral Tube size: 7.5 mm Number of attempts: 1 Airway Equipment and Method: Stylet Placement Confirmation: ETT inserted through vocal cords under direct vision, positive ETCO2 and breath sounds checked- equal and bilateral Secured at: 22 cm Tube secured with: Tape Dental Injury: Teeth and Oropharynx as per pre-operative assessment

## 2023-05-18 NOTE — Discharge Instructions (Addendum)
 DISCHARGE INSTRUCTIONS FOR PROSTATE SEED IMPLANTATION  Antibiotics You may be given a prescription for an antibiotic to take when you arrive home. If so, be sure to take every tablet in the bottle, even if you are feeling better before the prescription is finished. If you begin itching, notice a rash or start to swell on your trunk, arms, legs and/or throat, immediately stop taking the antibiotic and call your Urologist. Diet Resume your usual diet when you return home. To keep your bowels moving easily and softly, drink prune, apple and cranberry juice at room temperature. You may also take a stool softener, such as Colace, which is available without prescription at local pharmacies. Daily activities ? No driving or heavy lifting for at least two days after the implant. ? No bike riding, horseback riding or riding lawn mowers for the first month after the implant. ? Any strenuous physical activity should be approved by your doctor before you resume it. Sexual relations You may resume sexual relations two weeks after the procedure. A condom should be used for the first two weeks. Your semen may be dark brown or black; this is normal and is related bleeding that may have occurred during the implant. Postoperative swelling Expect swelling and bruising of the scrotum and perineum (the area between the scrotum and anus). Both the swelling and the bruising should resolve in l or 2 weeks. Ice packs and over- the-counter medications such as Tylenol, Advil or Aleve may lessen your discomfort. Postoperative urination Most men experience burning on urination and/or urinary frequency. If this becomes bothersome, contact your Urologist.  Medication can be prescribed to relieve these problems.  It is normal to have some blood in your urine for a few days after the implant. Special instructions related to the seeds It is unlikely that you will pass an Iodine-125 seed in your urine. The seeds are silver in color  and are about as large as a grain of rice. If you pass a seed, do not handle it with your fingers. Use a spoon to place it in an envelope or jar in place this in base occluded area such as the garage or basement for return to the radiation clinic at your convenience.  Contact your doctor for ? Temperature greater than 101 F ? Increasing pain ? Inability to urinate Follow-up  You should have follow up with your urologist and radiation oncologist about 3 weeks after the procedure. General information regarding Iodine seeds ? Iodine-125 is a low energy radioactive material. It is not deeply penetrating and loses energy at short distances. Your prostate will absorb the radiation. Objects that are touched or used by the patient do not become radioactive. ? Body wastes (urine and stool) or body fluids (saliva, tears, semen or blood) are not radioactive. ? The Nuclear Regulatory Commission [facility]) has determined that no radiation precautions are needed for patients undergoing Iodine-125 seed implantation. The [facility] states that such patients do not present a risk to the people around them, including young children and pregnant women. However, in keeping with the general principle that radiation exposure should be kept as low reasonably possible, we suggest the following: ? Children and pets should not sit on the patient's lap for the first two (2) weeks after the implant. ? Pregnant (or possibly pregnant) women should avoid prolonged, close contact with the patient for the first two (2) weeks after the implant. ? A distance of three (3) feet is acceptable. ? At a distance of three (3)  feet, there is no limit to the length of time anyone can be with the patient. ?

## 2023-05-18 NOTE — H&P (Signed)
 1 -High Risk Prostate Cancer - African American, No FHX prostate cancer. 6/12 cores up to 90% grade 2 cancer by BX 03/2021 on eval rising PSA to 29. TRUS 47ml with early median. H/o negative BX years prior.   2 - Erectile Dysfunction - on PDE5i since 50s (sildenafil 100mg , splits tabs)   PMH sig for HLD, HTN, Rt leg surgery. No CV disease / blood thinners. 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 His PCP is Sonda Primes MD.   Hx at [facility]:  07/25/17: MRI prostate negative for index lesion  01/01/19: MRI prostate suggest prostatitis, no index lesion, 29cc, no LND  10/01/20: PSA 28.1 ng/mL  03/15/21: TRUS PNB 6/12 cores + for adenocarcinoma, max GG2.  06/02/21: Pylarify PET/CT negative for metastatic disease, mild diffuse uptake in the prostate right>left.  06/02/21: MRI prostate PIRADS 5 lesion in left posterior lateral peripheral zone with concern for ECE and left neurovacular bundle invasion, 0.26cc. prostate vol: 43cc, PSAD 0.6  08/09/21 Fusion bx: negative (only 3 cores of lesion per patient request)  06/17/22 PSA 55  07/26/22 PSMA PET: no mets, avidity seen in right anterolateral prostate  11/2022 - rad onc consult  01/06/23 - med onc consult   Interval: Today the patient is here to reestablish care. He was seen by Dr. Berneice Heinrich last in 2023. At that point he opted to seek second opinion at [facility]. He is back here to reestablish with a local urologist and has already met with radiation oncology with plans to perform radiation for his high risk prostate cancer. He has been recommended to receive 24 to 36 months of ADT and external beam radiation therapy, but he has been very reluctant to entertain the idea of ADT. He also is very adamant that he does not want surgery.     ALLERGIES: Lisinopril No Allergies    MEDICATIONS: Allopurinol 100 mg tablet  Amlodipine Besylate 2.5 mg tablet  Aspirin 325 MG Oral Tablet Oral  Aspirin Ec 81 mg  tablet, delayed release  Ezetimibe 10 mg tablet  Lorazepam 1 mg tablet  Methyloprednisolone 4 Mg  Pravastatin Sodium 20 mg tablet  Sildenafil Citrate 100 mg tablet 1 tablet PO Daily PRN  Triamterene-Hydrochlorothiazid 37.5 mg-25 mg tablet  Turmeric  Vascepa 1 gram capsule  Vitamin D2  Vitamin D3  Zetia 10 mg tablet     GU PSH: Prostate Needle Biopsy - 2023, 2018       PSH Notes: Natural family planning, Leg Repair, knee surgery- right- 1983   NON-GU PSH: Surgical Pathology, Gross And Microscopic Examination For Prostate Needle - 2023, 2018     GU PMH: ED due to arterial insufficiency - 04/26/2021, - 03/23/2021, - 2023 Prostate Cancer - 04/26/2021, - 03/23/2021      PMH Notes: Erectile dysfunction: This has been managed with Viagra/sildenafil.   Fatty Liver   NON-GU PMH: Gout Hypercholesterolemia Hypertension    FAMILY HISTORY: Family Health Status - Father alive at age 3 - Mother Family Health Status - Mother's Age - Mother Graves' disease - Mother heart failure - Father hyperlipidemia - Mother Hypertension - Mother nephrolithiasis - Runs In Family Prostate Cancer - Uncle   SOCIAL HISTORY: Marital Status: Single Preferred Language: English; Ethnicity: Not Hispanic Or Latino; Race: Black or African American Current Smoking Status: Patient has never smoked.  Social Drinker.  Drinks 1 caffeinated drink per day. 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    REVIEW OF SYSTEMS:  GU Review Male:   Patient denies frequent urination, hard to postpone urination, burning/ pain with urination, get up at night to urinate, leakage of urine, stream starts and stops, trouble starting your stream, have to strain to urinate , erection problems, and penile pain.  Gastrointestinal (Upper):   Patient denies nausea, vomiting, and indigestion/ heartburn.  Gastrointestinal (Lower):   Patient denies diarrhea and constipation.  Constitutional:   Patient denies fever, night  sweats, weight loss, and fatigue.  Skin:   Patient denies skin rash/ lesion and itching.  Eyes:   Patient denies blurred vision and double vision.  Ears/ Nose/ Throat:   Patient denies sore throat and sinus problems.  Hematologic/Lymphatic:   Patient denies swollen glands and easy bruising.  Cardiovascular:   Patient denies leg swelling and chest pains.  Respiratory:   Patient denies cough and shortness of breath.  Endocrine:   Patient denies excessive thirst.  Musculoskeletal:   Patient denies back pain and joint pain.  Neurological:   Patient denies headaches and dizziness.  Psychologic:   Patient denies depression and anxiety.   VITAL SIGNS:      03/16/2023 01:00 PM  BP 169/97 mmHg  Pulse 86 /min   Complexity of Data:  Source Of History:  Patient, Healthcare Provider, Medical Record Summary  Lab Test Review:   PSA  Records Review:   Pathology Reports, Previous Doctor Records, Previous Patient Records, POC Tool  Urine Test Review:   Urinalysis  Urodynamics Review:   Review Bladder Scan  X-Ray Review: MRI Prostate GSORAD: Reviewed Report.     12/13/18 11/22/18 06/21/18 02/15/18 11/08/17 05/10/17 03/17/16 02/05/16  PSA  Total PSA 20.80 ng/mL 18.40 ng/mL 15.70 ng/mL 13.90 ng/mL 14.80 ng/mL 10.50 ng/mL 7.32 ng/dl 9.1 ng/dl  Free PSA       1.09 ng/dl   % Free PSA       5 % 5 %    PROCEDURES:         PVR Ultrasound - 60454  Scanned Volume: 72 cc         Visit Complexity - G2211          Urinalysis - 81003 Dipstick Dipstick Cont'd  Color: Yellow Bilirubin: Neg  Appearance: Clear Ketones: Neg  Specific Gravity: <=1.005 Blood: Neg  pH: 6.5 Protein: Neg  Glucose: Neg Urobilinogen: 0.2    Nitrites: Neg    Leukocyte Esterase: Neg    Notes:      ASSESSMENT:      ICD-10 Details  1 GU:   Prostate Cancer - C61      PLAN:            Medications New Meds: Orgovyx 120 mg tablet 1 tablet PO BID   #60  5 Refill(s)  Pharmacy Name:  [facility]  Address:   [address] [place], [place] [address]  Phone:  [phone]  Fax:  [phone]            Orders Labs PSA          Document Letter(s):  Created for Patient: Clinical Summary         Notes:   The patient has high risk prostate cancer based on his PSA. However, his recent biopsy which has been over a year ago demonstrated intermediate risk disease. The patient also had a PMSA scan that demonstrated no evidence of spread of his disease. Given that the patient is very reluctant to consider treatment as a high risk  cancer patient, and is very reluctant to start ADT. He is most concerned about the sexual side effects. He has already met with several radiation oncologist and every one of them has recommended 18 to 36 months of ADT in addition to external beam radiation.   We did discuss this again in detail. I encouraged him to consider his long-term outcome and the short-term disadvantages associated with ADT. I offered him a trial of Orgovyx because of the fairly easy reversibility of the testosterone depletion. He was willing to try it, and is getting do it for at least a week and see how he feels. If he is able to maintain it, he will need to be on it for 6 weeks prior to radiation therapy. Will keep in close contact with him and the cancer center so that we can coordinate his radiation treatment appropriately.

## 2023-05-18 NOTE — Anesthesia Preprocedure Evaluation (Signed)
 Anesthesia Evaluation  Patient identified by MRN, date of birth, ID band Patient awake    Reviewed: Allergy & Precautions, H&P , NPO status , Patient's Chart, lab work & pertinent test results  Airway Mallampati: II   Neck ROM: full    Dental   Pulmonary neg pulmonary ROS   breath sounds clear to auscultation       Cardiovascular hypertension,  Rhythm:regular Rate:Normal     Neuro/Psych   Anxiety        GI/Hepatic   Endo/Other    Renal/GU    Prostate CA    Musculoskeletal  (+) Arthritis ,  gout   Abdominal   Peds  Hematology   Anesthesia Other Findings   Reproductive/Obstetrics                             Anesthesia Physical Anesthesia Plan  ASA: 2  Anesthesia Plan: General   Post-op Pain Management:    Induction: Intravenous  PONV Risk Score and Plan: 2 and Ondansetron, Dexamethasone, Midazolam and Treatment may vary due to age or medical condition  Airway Management Planned: Oral ETT  Additional Equipment:   Intra-op Plan:   Post-operative Plan: Extubation in OR  Informed Consent: I have reviewed the patients History and Physical, chart, labs and discussed the procedure including the risks, benefits and alternatives for the proposed anesthesia with the patient or authorized representative who has indicated his/her understanding and acceptance.     Dental advisory given  Plan Discussed with: CRNA, Anesthesiologist and Surgeon  Anesthesia Plan Comments:        Anesthesia Quick Evaluation

## 2023-05-18 NOTE — Interval H&P Note (Signed)
 History and Physical Interval Note:  05/18/2023 9:28 AM  Joseph Livingston  has presented today for surgery, with the diagnosis of PROSTATE CANCER.  The various methods of treatment have been discussed with the patient and family. After consideration of risks, benefits and other options for treatment, the patient has consented to  Procedure(s) with comments: INSERTION, RADIATION SOURCE, PROSTATE (N/A) - 90 MINUTE CASE INJECTION, HYDROGEL SPACER (N/A) as a surgical intervention.  The patient's history has been reviewed, patient examined, no change in status, stable for surgery.  I have reviewed the patient's chart and labs.  Questions were answered to the patient's satisfaction.     Crist Fat

## 2023-05-18 NOTE — Op Note (Signed)
 Preoperative diagnosis:  Clinical stage TI C adenocarcinoma the prostate  Postoperative diagnosis:  Same  Procedure:  #1 I-125 prostate seed implantation  #2 cystourethroscopy #3 instillation of SpaceOAR biogel  Surgeon: Berniece Salines, M.D. Radiation Oncologist: Margaretmary Dys, M.D.  Anesthesia: Gen.   Indications: Patient  was diagnosed with clinical stage TI C prostate cancer. We had extensive discussion with him about treatment options versus. He elected to proceed with seed implantation. He underwent consultation my office as well as with Dr. Margaretmary Dys. He appeared to understand the advantages disadvantages potential risks of this treatment option. Full informed consent has been obtained. The patient is had preoperative ciprofloxacin. PAS compression boots were placed.   Technique and findings: Patient was brought the operating room where he had  successful induction of general anesthesia. He was placed in lithotomy position and prepped and draped in usual manner. Appropriate surgical timeout was performed. Radiation oncology department placed a transrectal ultrasound probe anchoring stand. Foley catheter with contrast in the balloon was inserted without difficulty. Anchoring needles were placed within the prostate.  Real-time contouring of the urethra prostate and rectum were performed and the dosing parameters were established. Targeted dose was 145 gray. We then came to the operating suite suite for placement of the needles. A second timeout was performed. All needle passage was done with real-time transrectal ultrasound guidance with the sagittal plane. A total of 19 needles were placed.  90 active seeds were implanted.  The brachytherapy template was then removed.  A site in the midline was selected on the perineum for placement of an 18 g needle with saline.  The needle was advanced above the rectum and below Denonvillier's fascia to the mid gland and confirmed to be in the midline  on transverse imaging.  One cc of saline was injected confirming appropriate expansion of this space.  A total of 5 cc of saline was then injected to open the space further bilaterally.  The saline syringe was then removed and the SpaceOAR hydrogel was injected with good distribution bilaterally.A Foley catheter was removed and flexible cystoscopy failed to show any seeds outside the prostate.  The patient was brought to recovery room in stable condition.

## 2023-05-18 NOTE — Interval H&P Note (Signed)
 History and Physical Interval Note:  05/18/2023 9:23 AM  Joseph Livingston  has presented today for surgery, with the diagnosis of PROSTATE CANCER.  The various methods of treatment have been discussed with the patient and family. After consideration of risks, benefits and other options for treatment, the patient has consented to  Procedure(s) with comments: INSERTION, RADIATION SOURCE, PROSTATE (N/A) - 90 MINUTE CASE INJECTION, HYDROGEL SPACER (N/A) as a surgical intervention.  The patient's history has been reviewed, patient examined, no change in status, stable for surgery.  I have reviewed the patient's chart and labs.  Questions were answered to the patient's satisfaction.     Crist Fat

## 2023-05-18 NOTE — Transfer of Care (Signed)
 Immediate Anesthesia Transfer of Care Note  Patient: Joseph Livingston  Procedure(s) Performed: INSERTION, RADIATION SOURCE, PROSTATE INJECTION, HYDROGEL SPACER  Patient Location: PACU  Anesthesia Type:General  Level of Consciousness: awake, alert , and patient cooperative  Airway & Oxygen Therapy: Patient Spontanous Breathing and Patient connected to face mask oxygen  Post-op Assessment: Report given to RN and Post -op Vital signs reviewed and stable  Post vital signs: Reviewed and stable  Last Vitals:  Vitals Value Taken Time  BP 162/106 05/18/23 1138  Temp    Pulse 95 05/18/23 1141  Resp 14 05/18/23 1141  SpO2 96 % 05/18/23 1141  Vitals shown include unfiled device data.  Last Pain:  Vitals:   05/18/23 0752  TempSrc:   PainSc: 0-No pain      Patients Stated Pain Goal: 6 (05/18/23 0752)  Complications: No notable events documented.

## 2023-05-18 NOTE — Progress Notes (Signed)
  Radiation Oncology         [phone] ________________________________  Name: Joseph Livingston MRN: [id number]  Date: 05/18/2023  DOB: 02/04/1966       Prostate Seed Implant  [id number], [name], MD  No ref. provider found  DIAGNOSIS:  58 y.o. gentleman with Stage T1c adenocarcinoma of the prostate with Gleason Score of 3+4, and PSA of 55.   Oncology History  Prostate CA [facility])  03/15/2021 Cancer Staging   Staging form: Prostate, AJCC 8th Edition - Clinical stage from 03/15/2021: Stage IIIA (cT1c, cN0, cM0, PSA: 55, Grade Group: 2) - Signed by Marcello Fennel, PA-C on 04/26/2023 Histopathologic type: Adenocarcinoma, NOS Stage prefix: Initial diagnosis Prostate specific antigen (PSA) range: 20 or greater Gleason primary pattern: 3 Gleason secondary pattern: 4 Gleason score: 7 Histologic grading system: 5 grade system   04/01/2021 Initial Diagnosis   Prostate CA (HCC)     No diagnosis found.  PROCEDURE: Insertion of radioactive I-125 seeds into the prostate gland.  RADIATION DOSE: 145 Gy, definitive therapy.  TECHNIQUE: [name] was brought to the operating room with the urologist. He was placed in the dorsolithotomy position. He was catheterized and a rectal tube was inserted. The perineum was shaved, prepped and draped. The ultrasound probe was then introduced by me into the rectum to see the prostate gland.  TREATMENT DEVICE: I attached the needle grid to the ultrasound probe stand and anchor needles were placed.  3D PLANNING: The prostate was imaged in 3D using a sagittal sweep of the prostate probe. These images were transferred to the planning computer. There, the prostate, urethra and rectum were defined on each axial reconstructed image. Then, the software created an optimized 3D plan and a few seed positions were adjusted. The quality of the plan was reviewed using [facility] information for the target and the following two organs at risk:  Urethra and Rectum.   Then the accepted plan was printed and handed off to the radiation therapist.  Under my supervision, the custom loading of the seeds and spacers was carried out using the quick loader.  These pre-loaded needles were then placed into the needle holder.Marland Kitchen  PROSTATE VOLUME STUDY:  Using transrectal ultrasound the volume of the prostate was verified to be 52.3 cc.  SPECIAL TREATMENT PROCEDURE/SUPERVISION AND HANDLING: The pre-loaded needles were then delivered by the urologist under sagittal guidance. A total of 19 needles were used to deposit 90 seeds in the prostate gland. The individual seed activity was 0.339 mCi.  SpaceOAR:  Yes  COMPLEX SIMULATION: At the end of the procedure, an anterior radiograph of the pelvis was obtained to document seed positioning and count. Cystoscopy was performed by the urologist to check the urethra and bladder.  MICRODOSIMETRY: At the end of the procedure, the patient was emitting 0.077 mR/hr at 1 meter. Accordingly, he was considered safe for hospital discharge.  PLAN: The patient will return to the radiation oncology clinic for post implant CT dosimetry in three weeks.   ________________________________  Artist Pais Kathrynn Running, M.D.

## 2023-05-18 NOTE — Anesthesia Postprocedure Evaluation (Signed)
 Anesthesia Post Note  Patient: Joseph Livingston  Procedure(s) Performed: INSERTION, RADIATION SOURCE, PROSTATE INJECTION, HYDROGEL SPACER     Patient location during evaluation: PACU Anesthesia Type: General Level of consciousness: awake and alert Pain management: pain level controlled Vital Signs Assessment: post-procedure vital signs reviewed and stable Respiratory status: spontaneous breathing, nonlabored ventilation, respiratory function stable and patient connected to nasal cannula oxygen Cardiovascular status: blood pressure returned to baseline and stable Postop Assessment: no apparent nausea or vomiting Anesthetic complications: no   No notable events documented.  Last Vitals:  Vitals:   05/18/23 1245 05/18/23 1300  BP: (!) 125/98 (!) 125/99  Pulse: 88 88  Resp:  18  Temp:  (!) 36.4 C  SpO2: 98% 99%    Last Pain:  Vitals:   05/18/23 1300  TempSrc:   PainSc: 3                  Cecely Rengel S

## 2023-05-19 ENCOUNTER — Encounter (HOSPITAL_COMMUNITY): Payer: Self-pay | Admitting: Urology

## 2023-05-28 ENCOUNTER — Other Ambulatory Visit: Payer: Self-pay | Admitting: Urology

## 2023-05-28 DIAGNOSIS — C61 Malignant neoplasm of prostate: Secondary | ICD-10-CM

## 2023-06-06 ENCOUNTER — Telehealth: Payer: Self-pay | Admitting: *Deleted

## 2023-06-06 NOTE — Telephone Encounter (Signed)
 Called patient to remind of post seed appts. and MRI, for 06-08-23, spoke with patient and he is aware of these appts. and the instructions

## 2023-06-08 ENCOUNTER — Ambulatory Visit (HOSPITAL_COMMUNITY)
Admission: RE | Admit: 2023-06-08 | Discharge: 2023-06-08 | Disposition: A | Discharge status: Home / Self Care | Source: Ambulatory Visit | Attending: Urology | Admitting: Urology

## 2023-06-08 ENCOUNTER — Other Ambulatory Visit: Payer: Self-pay | Admitting: Urology

## 2023-06-08 ENCOUNTER — Ambulatory Visit
Admission: RE | Admit: 2023-06-08 | Discharge: 2023-06-08 | Disposition: A | Discharge status: Home / Self Care | Payer: Self-pay | Source: Ambulatory Visit | Attending: Urology | Admitting: Urology

## 2023-06-08 ENCOUNTER — Encounter: Payer: Self-pay | Admitting: Urology

## 2023-06-08 ENCOUNTER — Ambulatory Visit
Admission: RE | Admit: 2023-06-08 | Discharge: 2023-06-08 | Disposition: A | Discharge status: Home / Self Care | Source: Ambulatory Visit | Attending: Radiation Oncology | Admitting: Radiation Oncology

## 2023-06-08 VITALS — BP 160/87 | HR 87 | Temp 96.8°F | Resp 18 | Ht 67.0 in | Wt 195.8 lb

## 2023-06-08 DIAGNOSIS — C61 Malignant neoplasm of prostate: Secondary | ICD-10-CM | POA: Insufficient documentation

## 2023-06-08 DIAGNOSIS — N5235 Erectile dysfunction following radiation therapy: Secondary | ICD-10-CM | POA: Insufficient documentation

## 2023-06-08 DIAGNOSIS — Z923 Personal history of irradiation: Secondary | ICD-10-CM | POA: Insufficient documentation

## 2023-06-08 DIAGNOSIS — Z79899 Other long term (current) drug therapy: Secondary | ICD-10-CM | POA: Insufficient documentation

## 2023-06-08 DIAGNOSIS — Z7982 Long term (current) use of aspirin: Secondary | ICD-10-CM | POA: Insufficient documentation

## 2023-06-08 DIAGNOSIS — N529 Male erectile dysfunction, unspecified: Secondary | ICD-10-CM | POA: Insufficient documentation

## 2023-06-08 MED ORDER — VIAGRA 100 MG PO TABS: 100.0000 mg | ORAL_TABLET | Freq: Every day | ORAL | 1 refills | Status: AC | PRN

## 2023-06-08 MED ORDER — TAMSULOSIN HCL 0.4 MG PO CAPS: 0.4000 mg | ORAL_CAPSULE | Freq: Every day | ORAL | 2 refills | Status: DC

## 2023-06-08 NOTE — Progress Notes (Signed)
 Radiation Oncology         [phone]) [phone] ________________________________  Name: Joseph Livingston MRN: [id number]  Date: 06/08/2023  DOB: 12/19/1965  Post-Seed Follow-Up Visit Note  CC: [name], [name], MD  [name], MD  Diagnosis:    58 y.o. gentleman with Stage T1c adenocarcinoma of the prostate with Gleason Score of 3+4, and PSA of 55.     ICD-10-CM   1. Malignant neoplasm of prostate (HCC)  C61     2. Prostate CA (HCC)  C61       Interval Since Last Radiation:  3 weeks 05/18/23:  Insertion of radioactive I-125 seeds into the prostate gland; 145 Gy, definitive therapy with placement of SpaceOAR gel.  Narrative:  The patient returns today for routine follow-up.  He is complaining of increased urinary frequency and urinary hesitation symptoms. He filled out a questionnaire regarding urinary function today providing and overall IPSS score of 15 characterizing his symptoms as moderate.  He did take Flomax daily for 1 week post-procedure as prescribed and could tell significant improvement in LUTS while taking but has since run out of the medication.  He does note some gradual improvement in the LUTS over the past 5-6 days but would like to continue taking the Flomax.  He denies dysuria or gross hematuria.  His pre-implant score was 1. He denies any abdominal pain or bowel symptoms but does report some increased rectal pressure with BMs. His energy level is satisfactory and overall, he is quite pleased with his progress to date.  ALLERGIES:  is allergic to lisinopril and crestor [rosuvastatin calcium].  Meds: Current Outpatient Medications  Medication Sig Dispense Refill   allopurinol (ZYLOPRIM) 100 MG tablet Take 1 tablet (100 mg total) by mouth daily. 30 tablet 6   AMBULATORY NON FORMULARY MEDICATION Medication Name: whey powder     amLODipine (NORVASC) 2.5 MG tablet TAKE 1 TABLET BY MOUTH EVERY DAY 90 tablet 3   aspirin 81 MG tablet Take 81 mg by mouth daily.      Cholecalciferol (VITAMIN D3) 2000 units capsule Take 1 capsule (2,000 Units total) by mouth daily. 100 capsule 3   diphenhydramine-acetaminophen (TYLENOL PM) 25-500 MG TABS tablet Take 1 tablet by mouth at bedtime.     ezetimibe (ZETIA) 10 MG tablet TAKE 1 TABLET BY MOUTH EVERY DAY 90 tablet 3   icosapent Ethyl (VASCEPA) 1 g capsule TAKE 2 CAPSULES BY MOUTH TWICE A DAY (Patient not taking: Reported on 04/25/2023) 360 capsule 0   LORazepam (ATIVAN) 1 MG tablet Take 0.5-1 tablets (0.5-1 mg total) by mouth 2 (two) times daily as needed for anxiety. (Patient not taking: Reported on 04/25/2023) 60 tablet 1   MAGNESIUM CHLORIDE PO Take 1 tablet by mouth daily.     MELATONIN PO Take 1 tablet by mouth at bedtime as needed (sleep).     Multiple Vitamins-Minerals (ZINC PO) Take 1 tablet by mouth daily.     pravastatin (PRAVACHOL) 20 MG tablet TAKE 1 TABLET BY MOUTH EVERY DAY (Patient taking differently: Take 40 mg by mouth daily.) 90 tablet 3   sildenafil (VIAGRA) 100 MG tablet TAKE 1 TABLET BY MOUTH EVERY DAY AS NEEDED FOR ERECTILE DYSFUNCTION 30 tablet 5   tamsulosin (FLOMAX) 0.4 MG CAPS capsule Take 1 capsule (0.4 mg total) by mouth daily. 7 capsule 0   traMADol (ULTRAM) 50 MG tablet Take 1-2 tablets (50-100 mg total) by mouth every 6 (six) hours as needed for moderate pain (pain score 4-6). 15  tablet 0   triamterene-hydrochlorothiazide (MAXZIDE-25) 37.5-25 MG tablet TAKE 1 TABLET BY MOUTH DAILY. ANNUAL APPT DUE IN AUGUST MUST SEE PROVIDER FOR FUTURE REFILLS 90 tablet 1   No current facility-administered medications for this encounter.    Physical Findings: In general this is a well appearing African American male in no acute distress. He's alert and oriented x4 and appropriate throughout the examination. Cardiopulmonary assessment is negative for acute distress and he exhibits normal effort.   Lab Findings: Lab Results  Component Value Date   WBC 4.8 05/03/2023   HGB 15.3 05/03/2023   HCT 44.2  05/03/2023   MCV 88.2 05/03/2023   PLT 259 05/03/2023    Radiographic Findings:  Patient underwent CT imaging in our clinic for post implant dosimetry. The CT will be reviewed by Dr. Kathrynn Running to confirm there is an adequate distribution of radioactive seeds throughout the prostate gland and ensure that there are no seeds in or near the rectum.  We suspect the final radiation plan and dosimetry will show appropriate coverage of the prostate gland. He understands that we will call and inform him of any unexpected findings on further review of his imaging and dosimetry.  Impression/Plan:  58 y.o. gentleman with Stage T1c adenocarcinoma of the prostate with Gleason Score of 3+4, and PSA of 55.  The patient is recovering from the effects of radiation. His urinary symptoms should gradually improve over the next 4-6 months. We talked about this today. He will resume taking Flomax (Rx provided today) and is encouraged by his improvement already and is otherwise pleased with his outcome. We also talked about long-term follow-up for prostate cancer following seed implant. He understands that ongoing PSA determinations and digital rectal exams will help perform surveillance to rule out disease recurrence. He has a follow up appointment scheduled with Anne Fu, NP on 06/14/23 and will be scheduled for a follow up visit with Dr. Marlou Porch in 09/2023 following his initial post-treatment PSA. He understands what to expect with his PSA measures. Patient was also educated today about some of the long-term effects from radiation including a small risk for rectal bleeding and possibly erectile dysfunction. We talked about some of the general management approaches to these potential complications. However, I did encourage the patient to contact our office or return at any point if he has questions or concerns related to his previous radiation and prostate cancer.  2. Erectile dysfunction: He has some pre-existing ED that has  been exacerbated by the stress with his prostate cancer diagnosis and recent treatment so he wishes to try Viagra to help build his confidence back. He has tried generic sildenafil in the past without success but has had branded Viagra previously that worked well. I have sent a Rx for Viagra 100mg  to his pharmacy and he will let his urologist know how he responds so that they can continue to manage going forward.    Marguarite Arbour, PA-C

## 2023-06-08 NOTE — Progress Notes (Signed)
  Radiation Oncology         [phone]) [phone] ________________________________  Name: Joseph Livingston MRN: [id number]  Date: 06/08/2023  DOB: 03-01-1966  COMPLEX SIMULATION NOTE  NARRATIVE:  The patient was brought to the CT Simulation planning suite today following prostate seed implantation approximately one month ago.  Identity was confirmed.  All relevant records and images related to the planned course of therapy were reviewed.  Then, the patient was set-up supine.  CT images were obtained.  The CT images were loaded into the planning software.  Then the prostate and rectum were contoured.  Treatment planning then occurred.  The implanted iodine 125 seeds were identified by the physics staff for projection of radiation distribution  I have requested : 3D Simulation  I have requested a DVH of the following structures: Prostate and rectum.    ________________________________  Artist Pais Kathrynn Running, M.D.

## 2023-06-08 NOTE — Progress Notes (Signed)
 Post-seed nursing interview for a diagnosis of Prostate CA (HCC) Stage IIIA (cT1c, cN0, cM0, PSA: 55, Grade Group: 2).  Patient identity verified x2.   Patient reports mild rectal pressure (space oar gel pack), soft stools, and mild bladder emptying issue. Patient denies any other related issues at this time.  Meaningful use complete.  I-PSS (AUA) score- 15 - Moderate SHIM (ED) score- 15 Urinary Management medication(s) None- Tamsulosin finished (had 6 tabs only).  Urology appointment date- 06/14/2023 with Dr. Marlou Porch at [facility]  Vitals- BP (!) 160/87 (BP Location: Right Arm, Patient Position: Sitting, Cuff Size: Normal)   Pulse 87   Temp (!) 96.8 F (36 C) (Temporal)   Resp 18   Ht 5\' 7"  (1.702 m)   Wt 195 lb 12.8 oz (88.8 kg)   SpO2 100%   BMI 30.67 kg/m   This concludes the interaction.  Ruel Favors, LPN

## 2023-06-12 ENCOUNTER — Encounter: Payer: Self-pay | Admitting: Radiation Oncology

## 2023-06-12 DIAGNOSIS — C61 Malignant neoplasm of prostate: Secondary | ICD-10-CM | POA: Diagnosis present

## 2023-06-12 NOTE — Radiation Completion Notes (Signed)
 Patient Name: EAMONN, SERMENO MRN: [id number] Date of Birth: 01/20/1966 Referring Physician: Rosalene Billings, M.D. Date of Service: 2023-06-12 Radiation Oncologist: Margaretmary Bayley, M.D. [facility]                             RADIATION ONCOLOGY END OF TREATMENT NOTE     Diagnosis: C61 Malignant neoplasm of prostate Staging on 2021-03-15: Prostate CA (HCC) T=cT1c, N=cN0, M=cM0 Intent: Curative     ==========DELIVERED PLANS==========  Prostate Seed Implant Date: 2023-05-18   Plan Name: Prostate Seed Implant Site: Prostate Technique: Radioactive Seed Implant I-125 Mode: Brachytherapy Dose Per Fraction: 145 Gy Prescribed Dose (Delivered / Prescribed): 145 Gy / 145 Gy Prescribed Fxs (Delivered / Prescribed): 1 / 1     ==========ON TREATMENT VISIT DATES========== 2023-05-18     ==========UPCOMING VISITS==========

## 2023-06-12 NOTE — Progress Notes (Signed)
  Radiation Oncology         [phone]) [phone] ________________________________  Name: Joseph Livingston MRN: [id number]  Date: 06/12/2023  DOB: 12-16-1965  3D Planning Note   Prostate Brachytherapy Post-Implant Dosimetry  Diagnosis: 58 y.o. gentleman with Stage T1c adenocarcinoma of the prostate with Gleason Score of 3+4, and PSA of 55.   Narrative: On a previous date, Joseph Livingston returned following prostate seed implantation for post implant planning. He underwent CT scan complex simulation to delineate the three-dimensional structures of the pelvis and demonstrate the radiation distribution.  Since that time, the seed localization, and complex isodose planning with dose volume histograms have now been completed.  Results:   Prostate Coverage - The dose of radiation delivered to the 90% or more of the prostate gland (D90) was 110.88% of the prescription dose. This exceeds our goal of greater than 90%. Rectal Sparing - The volume of rectal tissue receiving the prescription dose or higher was 0.0 cc. This falls under our thresholds tolerance of 1.0 cc.  Impression: The prostate seed implant appears to show adequate target coverage and appropriate rectal sparing.  Plan:  The patient will continue to follow with urology for ongoing PSA determinations. I would anticipate a high likelihood for local tumor control with minimal risk for rectal morbidity.  ________________________________  Artist Pais Kathrynn Running, M.D.

## 2023-06-16 ENCOUNTER — Encounter: Payer: Self-pay | Admitting: *Deleted

## 2023-06-27 ENCOUNTER — Encounter: Payer: Self-pay | Admitting: Internal Medicine

## 2023-06-27 NOTE — Telephone Encounter (Signed)
 Pt has dropped of FMLA forms and they have been placed in the PCP's box

## 2023-07-05 NOTE — Telephone Encounter (Signed)
 Copied from CRM [id number]. Topic: General - Other >> Jul 05, 2023 12:01 PM Abigail D wrote: Reason for CRM: Patient following up on FL[employer] status per 4/29 messages MyChart.

## 2023-07-27 ENCOUNTER — Encounter: Payer: Self-pay | Admitting: *Deleted

## 2023-07-27 ENCOUNTER — Inpatient Hospital Stay: Attending: Radiation Oncology | Admitting: *Deleted

## 2023-07-27 DIAGNOSIS — C61 Malignant neoplasm of prostate: Secondary | ICD-10-CM

## 2023-07-27 NOTE — Progress Notes (Signed)
SCP reviewed and completed. 

## 2023-08-09 ENCOUNTER — Other Ambulatory Visit: Payer: Self-pay | Admitting: Internal Medicine

## 2023-08-09 MED ORDER — ALLOPURINOL 100 MG PO TABS: 100.0000 mg | ORAL_TABLET | Freq: Every day | ORAL | 6 refills | Status: DC

## 2023-08-09 NOTE — Telephone Encounter (Signed)
 Copied from CRM [id number]. Topic: Clinical - Medication Refill >> Aug 09, 2023 10:11 AM Allyne Areola wrote: Medication: allopurinol  (ZYLOPRIM ) 100 MG tablet [[id number]]  Has the patient contacted their pharmacy? No (Agent: If no, request that the patient contact the pharmacy for the refill. If patient does not wish to contact the pharmacy document the reason why and proceed with request.) (Agent: If yes, when and what did the pharmacy advise?)  This is the patient's preferred pharmacy:  [phone] [place] - [address] [place] [address] Phone: [phone] Fax: [phone]  Is this the correct pharmacy for this prescription? Yes If no, delete pharmacy and type the correct one.   Has the prescription been filled recently? No  Is the patient out of the medication? No  Has the patient been seen for an appointment in the last year OR does the patient have an upcoming appointment? Yes  Can we respond through MyChart? Yes  Agent: Please be advised that Rx refills may take up to 3 business days. We ask that you follow-up with your pharmacy.

## 2023-08-09 NOTE — Telephone Encounter (Signed)
 Copied from CRM [id number]. Topic: Clinical - Prescription Issue >> Aug 09, 2023 10:54 AM Caliyah H wrote: Reason for CRM: Patient called stated the pharmacy was unable to fill allopurinol  due to it being over two years since it was last filled.

## 2023-08-10 ENCOUNTER — Ambulatory Visit: Admitting: Podiatry

## 2023-08-10 ENCOUNTER — Ambulatory Visit (INDEPENDENT_AMBULATORY_CARE_PROVIDER_SITE_OTHER)

## 2023-08-10 ENCOUNTER — Encounter: Payer: Self-pay | Admitting: Podiatry

## 2023-08-10 VITALS — Ht 67.0 in | Wt 195.8 lb

## 2023-08-10 DIAGNOSIS — M76822 Posterior tibial tendinitis, left leg: Secondary | ICD-10-CM

## 2023-08-10 DIAGNOSIS — M7752 Other enthesopathy of left foot: Secondary | ICD-10-CM | POA: Diagnosis not present

## 2023-08-10 DIAGNOSIS — M7751 Other enthesopathy of right foot: Secondary | ICD-10-CM

## 2023-08-10 MED ORDER — TRIAMCINOLONE ACETONIDE 10 MG/ML IJ SUSP
10.0000 mg | Freq: Once | INTRAMUSCULAR | Status: AC
  Administered 2023-08-10: 10 mg via INTRA_ARTICULAR

## 2023-08-10 NOTE — Progress Notes (Signed)
 Subjective:   Patient ID: Joseph Livingston, male   DOB: 58 y.o.   MRN: [id number]   HPI Patient states he developed a lot of pain in his left medial ankle he has to work intense hours and he knows he needs new orthotics this has been a number of years.  Patient's not been seen in about a year and a half   ROS      Objective:  Physical Exam  Neurovascular status intact with inflammation pain of the medial ankle left no indication of muscle strength loss quite a bit of pain with palpation with fluid buildup and flatfoot deformity     Assessment:  Acute posterior tibial tendinitis left inflammation with moderate flatfoot deformity history of orthotics that have helped him     Plan:  H&P reviewed and went ahead today did careful sheath injection left 3 mg dexamethasone  Kenalog  5 mg Xylocaine  advised on ice therapy and casted for functional orthotics to lift up his arches.  Did explain risk of injection prior to doing this and if symptoms persist may require MRI  X-rays indicate moderate depression of the arch no indication of arthritis within the ankle joint subtalar joint

## 2023-08-11 NOTE — Telephone Encounter (Signed)
 [facility] and he was able to inform me rx is ready for pick up at another pharmacy as [facility].

## 2023-08-14 ENCOUNTER — Telehealth: Payer: Self-pay

## 2023-08-14 NOTE — Telephone Encounter (Signed)
 Patient had called on Friday 08/11/23, requesting a note to be out of work for the weekend and a couple of days this week. 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   I returned call to patient this morning and he stated that no one had gotten back with him regarding this. He says that he made his way through it. He is not having any pain at the moment,but does have stiffness first thing in the morning,but once he gets going and takes Aleve he seems to be fine.He says that he will just continue to monitor and if he feels he needs to take a few days he will get back in contact with the office.

## 2023-08-21 ENCOUNTER — Ambulatory Visit: Admitting: Podiatry

## 2023-08-23 ENCOUNTER — Ambulatory Visit: Admitting: Podiatry

## 2023-08-23 ENCOUNTER — Encounter: Payer: Self-pay | Admitting: Podiatry

## 2023-08-23 DIAGNOSIS — M7752 Other enthesopathy of left foot: Secondary | ICD-10-CM

## 2023-08-23 DIAGNOSIS — M76822 Posterior tibial tendinitis, left leg: Secondary | ICD-10-CM

## 2023-08-23 MED ORDER — PREDNISONE 10 MG PO TABS: ORAL_TABLET | ORAL | 0 refills | Status: AC

## 2023-08-23 MED ORDER — TRIAMCINOLONE ACETONIDE 10 MG/ML IJ SUSP
10.0000 mg | Freq: Once | INTRAMUSCULAR | Status: AC
  Administered 2023-08-23: 10 mg via INTRA_ARTICULAR

## 2023-08-24 NOTE — Progress Notes (Signed)
 Subjective:   Patient ID: Joseph Livingston, male   DOB: 58 y.o.   MRN: [id number]   HPI Patient states that you are feeling some better but having a lot of pain in the left ankle and it has been impossible for him to work his shifts at work and being on concrete floors   ROS      Objective:  Physical Exam  Neurovascular status intact with exquisite discomfort into the sinus tarsi left inflammation fluid around the joint itself     Assessment:  Inflammatory capsulitis left sinus tarsi fluid buildup with improved discomfort in the medial side of the foot     Plan:  H&P reviewed he is having trouble working organ to take him off of work for a week to allow him to rest and I did go ahead did sterile prep injected the sinus tarsi left 3 mg Kenalog  5 mg Xylocaine  applied sterile dressing will be seen back to reevaluate when orthotics are returned

## 2023-08-25 ENCOUNTER — Ambulatory Visit: Admitting: Podiatry

## 2023-08-30 DIAGNOSIS — Z0271 Encounter for disability determination: Secondary | ICD-10-CM

## 2023-08-30 NOTE — Telephone Encounter (Signed)
 Recd [employer] form faxed form/note [phone]

## 2023-08-31 NOTE — Telephone Encounter (Signed)
 pt lft mess to confirm RTW info faxed. I adv him it was faxed on yesterday

## 2023-09-11 ENCOUNTER — Telehealth: Payer: Self-pay | Admitting: *Deleted

## 2023-09-11 ENCOUNTER — Other Ambulatory Visit: Payer: Self-pay | Admitting: Radiation Oncology

## 2023-09-11 MED ORDER — TAMSULOSIN HCL 0.4 MG PO CAPS: 0.4000 mg | ORAL_CAPSULE | Freq: Every day | ORAL | 5 refills | Status: DC

## 2023-09-11 NOTE — Telephone Encounter (Signed)
Called patient to inform that script is ready for pick-up, spoke with patient and he is aware of this 

## 2023-09-14 ENCOUNTER — Ambulatory Visit

## 2023-09-14 NOTE — Progress Notes (Signed)
 Patient presents today to pick up custom molded foot orthotics, diagnosed with bone spur Left  by Dr. Magdalen.   Orthotics were dispensed and fit was satisfactory. Reviewed instructions for break-in and wear. Written instructions given to patient.  Patient will follow up as needed.   Lolita Schultze CPed, CFo, CFm

## 2023-09-27 ENCOUNTER — Encounter: Payer: Self-pay | Admitting: Internal Medicine

## 2023-10-04 ENCOUNTER — Other Ambulatory Visit: Payer: Self-pay | Admitting: Internal Medicine

## 2023-10-04 DIAGNOSIS — Z0000 Encounter for general adult medical examination without abnormal findings: Secondary | ICD-10-CM

## 2023-11-16 ENCOUNTER — Other Ambulatory Visit: Payer: Self-pay | Admitting: Internal Medicine

## 2023-11-28 ENCOUNTER — Other Ambulatory Visit: Payer: Self-pay | Admitting: Internal Medicine

## 2023-11-28 NOTE — Telephone Encounter (Signed)
 Copied from CRM [id number]. Topic: Clinical - Medication Refill >> Nov 28, 2023  8:38 AM Rosina BIRCH wrote: Medication: triamterene -hydrochlorothiazide   Has the patient contacted their pharmacy? Yes (Agent: If no, request that the patient contact the pharmacy for the refill. If patient does not wish to contact the pharmacy document the reason why and proceed with request.) (Agent: If yes, when and what did the pharmacy advise?)  This is the patient's preferred pharmacy:  [facility] [phone] [facility] [place] - [address] [place] [address] Phone: [phone] Fax: [phone]  Is this the correct pharmacy for this prescription? Yes If no, delete pharmacy and type the correct one.   Has the prescription been filled recently? Yes  Is the patient out of the medication? Yes  Has the patient been seen for an appointment in the last year OR does the patient have an upcoming appointment? yes  Can we respond through MyChart? Yes  Agent: Please be advised that Rx refills may take up to 3 business days. We ask that you follow-up with your pharmacy.

## 2023-12-01 ENCOUNTER — Other Ambulatory Visit: Payer: Self-pay

## 2023-12-01 ENCOUNTER — Telehealth: Payer: Self-pay

## 2023-12-01 MED ORDER — TRIAMTERENE-HCTZ 37.5-25 MG PO TABS: 1.0000 | ORAL_TABLET | Freq: Every day | ORAL | 1 refills | Status: AC

## 2023-12-01 NOTE — Telephone Encounter (Signed)
 Copied from CRM #[id number]. Topic: Clinical - Prescription Issue >> Dec 01, 2023  3:45 PM Terri MATSU wrote: Reason for CRM: Patient refilled his medication on 9/23 triamterene -hydrochlorothiazide, and it's still pending. He has been without the medication for 2days and he needs it since it's for his blood pressure. Callback number [phone]

## 2023-12-05 ENCOUNTER — Other Ambulatory Visit: Payer: Self-pay | Admitting: Internal Medicine

## 2023-12-07 ENCOUNTER — Other Ambulatory Visit: Payer: Self-pay

## 2023-12-07 MED ORDER — EZETIMIBE 10 MG PO TABS: 10.0000 mg | ORAL_TABLET | Freq: Every day | ORAL | 0 refills | Status: DC

## 2023-12-26 ENCOUNTER — Telehealth: Payer: Self-pay | Admitting: Internal Medicine

## 2023-12-26 NOTE — Telephone Encounter (Signed)
Paperwork placed on PCP desk

## 2023-12-26 NOTE — Telephone Encounter (Signed)
  error

## 2023-12-26 NOTE — Telephone Encounter (Signed)
 Pt dropped off FMLA paperwork to be filled out by PCP. He is due for his CPE he is scheduled for that on 10.30.25 @ 10:40Am.  Please advise, Thanks

## 2023-12-29 ENCOUNTER — Other Ambulatory Visit: Payer: Self-pay | Admitting: Internal Medicine

## 2024-01-02 ENCOUNTER — Other Ambulatory Visit: Payer: Self-pay | Admitting: Internal Medicine

## 2024-01-03 NOTE — Telephone Encounter (Signed)
 Pt has been informed his FMLA paperwork is complete and has ben faxed as well.  Pt copy will be given to him at his physical appointment on 01/04/24.

## 2024-01-04 ENCOUNTER — Ambulatory Visit (INDEPENDENT_AMBULATORY_CARE_PROVIDER_SITE_OTHER): Admitting: Internal Medicine

## 2024-01-04 ENCOUNTER — Encounter: Payer: Self-pay | Admitting: Internal Medicine

## 2024-01-04 VITALS — BP 148/82 | HR 97 | Ht 67.0 in | Wt 196.8 lb

## 2024-01-04 DIAGNOSIS — I10 Essential (primary) hypertension: Secondary | ICD-10-CM

## 2024-01-04 DIAGNOSIS — I2510 Atherosclerotic heart disease of native coronary artery without angina pectoris: Secondary | ICD-10-CM

## 2024-01-04 DIAGNOSIS — E785 Hyperlipidemia, unspecified: Secondary | ICD-10-CM | POA: Diagnosis not present

## 2024-01-04 DIAGNOSIS — E559 Vitamin D deficiency, unspecified: Secondary | ICD-10-CM | POA: Diagnosis not present

## 2024-01-04 DIAGNOSIS — Z0000 Encounter for general adult medical examination without abnormal findings: Secondary | ICD-10-CM

## 2024-01-04 DIAGNOSIS — I2583 Coronary atherosclerosis due to lipid rich plaque: Secondary | ICD-10-CM

## 2024-01-04 DIAGNOSIS — C61 Malignant neoplasm of prostate: Secondary | ICD-10-CM

## 2024-01-04 LAB — COMPREHENSIVE METABOLIC PANEL WITH GFR
ALT: 120 U/L — ABNORMAL HIGH (ref 0–53)
AST: 90 U/L — ABNORMAL HIGH (ref 0–37)
Albumin: 5.4 g/dL — ABNORMAL HIGH (ref 3.5–5.2)
Alkaline Phosphatase: 72 U/L (ref 39–117)
BUN: 15 mg/dL (ref 6–23)
CO2: 27 meq/L (ref 19–32)
Calcium: 10.4 mg/dL (ref 8.4–10.5)
Chloride: 91 meq/L — ABNORMAL LOW (ref 96–112)
Creatinine, Ser: 1.01 mg/dL (ref 0.40–1.50)
GFR: 82.51 mL/min (ref >60.00)
Glucose, Bld: 113 mg/dL — ABNORMAL HIGH (ref 70–99)
Potassium: 3.9 meq/L (ref 3.5–5.1)
Sodium: 130 meq/L — ABNORMAL LOW (ref 135–145)
Total Bilirubin: 1.5 mg/dL — ABNORMAL HIGH (ref 0.2–1.2)
Total Protein: 8.9 g/dL — ABNORMAL HIGH (ref 6.0–8.3)

## 2024-01-04 LAB — URINALYSIS
Bilirubin Urine: NEGATIVE
Hgb urine dipstick: NEGATIVE
Ketones, ur: NEGATIVE
Leukocytes,Ua: NEGATIVE
Nitrite: NEGATIVE
Specific Gravity, Urine: 1.01 (ref 1.000–1.030)
Total Protein, Urine: NEGATIVE
Urine Glucose: NEGATIVE
Urobilinogen, UA: 0.2 (ref 0.0–1.0)
pH: 7 (ref 5.0–8.0)

## 2024-01-04 LAB — CBC WITH DIFFERENTIAL/PLATELET
Basophils Absolute: 0 K/uL (ref 0.0–0.1)
Basophils Relative: 1.3 % (ref 0.0–3.0)
Eosinophils Absolute: 0.1 K/uL (ref 0.0–0.7)
Eosinophils Relative: 1.9 % (ref 0.0–5.0)
HCT: 44.7 % (ref 39.0–52.0)
Hemoglobin: 15.2 g/dL (ref 13.0–17.0)
Lymphocytes Relative: 27.5 % (ref 12.0–46.0)
Lymphs Abs: 1 K/uL (ref 0.7–4.0)
MCHC: 34 g/dL (ref 30.0–36.0)
MCV: 88.2 fl (ref 78.0–100.0)
Monocytes Absolute: 0.4 K/uL (ref 0.1–1.0)
Monocytes Relative: 10.8 % (ref 3.0–12.0)
Neutro Abs: 2.1 K/uL (ref 1.4–7.7)
Neutrophils Relative %: 58.5 % (ref 43.0–77.0)
Platelets: 244 K/uL (ref 150.0–400.0)
RBC: 5.06 Mil/uL (ref 4.22–5.81)
RDW: 13.7 % (ref 11.5–15.5)
WBC: 3.6 K/uL — ABNORMAL LOW (ref 4.0–10.5)

## 2024-01-04 LAB — LIPID PANEL
Cholesterol: 361 mg/dL — ABNORMAL HIGH (ref 0–200)
HDL: 66.4 mg/dL (ref >39.00)
LDL Cholesterol: 245 mg/dL — ABNORMAL HIGH (ref 0–99)
NonHDL: 295.08
Total CHOL/HDL Ratio: 5
Triglycerides: 250 mg/dL — ABNORMAL HIGH (ref 0.0–149.0)
VLDL: 50 mg/dL — ABNORMAL HIGH (ref 0.0–40.0)

## 2024-01-04 LAB — TSH: TSH: 1.74 u[IU]/mL (ref 0.35–5.50)

## 2024-01-04 MED ORDER — TADALAFIL 5 MG PO TABS
5.0000 mg | ORAL_TABLET | Freq: Every day | ORAL | 3 refills | Status: AC
Start: 2024-01-04 — End: ?

## 2024-01-04 NOTE — Progress Notes (Signed)
 Subjective:  Patient ID: Joseph Livingston, male    DOB: 07/25/1965  Age: 58 y.o. MRN: [id number]  CC: Annual Exam (Annual Exam)   HPI Joseph Livingston presents for a well exam S/p XRT seeds in 05/2023 for prostate cancer by Dr. Cam and Dr. Patrcia Craw ED, BPH  Outpatient Medications Prior to Visit  Medication Sig Dispense Refill   allopurinol  (ZYLOPRIM ) 100 MG tablet Take 1 tablet (100 mg total) by mouth daily. 30 tablet 6   AMBULATORY NON FORMULARY MEDICATION Medication Name: whey powder     amLODipine  (NORVASC ) 2.5 MG tablet TAKE 1 TABLET BY MOUTH EVERY DAY 90 tablet 1   aspirin 81 MG tablet Take 81 mg by mouth daily.     Cholecalciferol (VITAMIN D3) 2000 units capsule Take 1 capsule (2,000 Units total) by mouth daily. 100 capsule 3   diphenhydramine-acetaminophen (TYLENOL PM) 25-500 MG TABS tablet Take 1 tablet by mouth at bedtime.     ezetimibe  (ZETIA ) 10 MG tablet TAKE 1 TABLET BY MOUTH EVERY DAY 90 tablet 1   icosapent  Ethyl (VASCEPA ) 1 g capsule TAKE 2 CAPSULES BY MOUTH TWICE A DAY 360 capsule 0   LORazepam  (ATIVAN ) 1 MG tablet Take 0.5-1 tablets (0.5-1 mg total) by mouth 2 (two) times daily as needed for anxiety. 60 tablet 1   MAGNESIUM CHLORIDE PO Take 1 tablet by mouth daily.     MELATONIN PO Take 1 tablet by mouth at bedtime as needed (sleep).     Multiple Vitamins-Minerals (ZINC PO) Take 1 tablet by mouth daily.     pravastatin  (PRAVACHOL ) 20 MG tablet TAKE 1 TABLET BY MOUTH EVERY DAY 90 tablet 3   predniSONE  (DELTASONE ) 10 MG tablet 12 day tapering dose 48 tablet 0   tamsulosin  (FLOMAX ) 0.4 MG CAPS capsule Take 1 capsule (0.4 mg total) by mouth daily after supper. 30 capsule 5   triamterene -hydrochlorothiazide (MAXZIDE-25) 37.5-25 MG tablet Take 1 tablet by mouth daily. Annual appt due in August must see provider for future refills 90 tablet 1   VIAGRA  100 MG tablet Take 1 tablet (100 mg total) by mouth daily as needed for erectile dysfunction. 30 tablet 1    No facility-administered medications prior to visit.    ROS: Review of Systems  Constitutional:  Negative for appetite change, fatigue and unexpected weight change.  HENT:  Negative for congestion, nosebleeds, sneezing, sore throat and trouble swallowing.   Eyes:  Negative for itching and visual disturbance.  Respiratory:  Negative for cough.   Cardiovascular:  Negative for chest pain, palpitations and leg swelling.  Gastrointestinal:  Negative for abdominal distention, blood in stool, diarrhea and nausea.  Genitourinary:  Negative for frequency and hematuria.  Musculoskeletal:  Negative for back pain, gait problem, joint swelling and neck pain.  Skin:  Negative for rash.  Neurological:  Negative for dizziness, tremors, speech difficulty and weakness.  Psychiatric/Behavioral:  Negative for agitation, dysphoric mood, sleep disturbance and suicidal ideas. The patient is not nervous/anxious.     Objective:  BP (!) 148/82   Pulse 97   Ht 5' 7 (1.702 m)   Wt 196 lb 12.8 oz (89.3 kg)   SpO2 99%   BMI 30.82 kg/m   BP Readings from Last 3 Encounters:  01/04/24 (!) 148/82  06/08/23 (!) 160/87  05/18/23 (!) 125/99    Wt Readings from Last 3 Encounters:  01/04/24 196 lb 12.8 oz (89.3 kg)  08/10/23 195 lb 12.8 oz (88.8 kg)  06/08/23 195 lb 12.8  oz (88.8 kg)    Physical Exam Constitutional:      General: He is not in acute distress.    Appearance: Normal appearance. He is well-developed.     Comments: NAD  Eyes:     Conjunctiva/sclera: Conjunctivae normal.     Pupils: Pupils are equal, round, and reactive to light.  Neck:     Thyroid : No thyromegaly.     Vascular: No JVD.  Cardiovascular:     Rate and Rhythm: Normal rate and regular rhythm.     Heart sounds: Normal heart sounds. No murmur heard.    No friction rub. No gallop.  Pulmonary:     Effort: Pulmonary effort is normal. No respiratory distress.     Breath sounds: Normal breath sounds. No wheezing or rales.   Chest:     Chest wall: No tenderness.  Abdominal:     General: Bowel sounds are normal. There is no distension.     Palpations: Abdomen is soft. There is no mass.     Tenderness: There is no abdominal tenderness. There is no guarding or rebound.  Musculoskeletal:        General: No tenderness. Normal range of motion.     Cervical back: Normal range of motion.  Lymphadenopathy:     Cervical: No cervical adenopathy.  Skin:    General: Skin is warm and dry.     Findings: No rash.  Neurological:     Mental Status: He is alert and oriented to person, place, and time.     Cranial Nerves: No cranial nerve deficit.     Motor: No abnormal muscle tone.     Coordination: Coordination normal.     Gait: Gait normal.     Deep Tendon Reflexes: Reflexes are normal and symmetric.  Psychiatric:        Behavior: Behavior normal.        Thought Content: Thought content normal.        Judgment: Judgment normal.    I spent 22 minutes in addition to time for CPX wellness examination in preparing to see the patient by review of recent labs, imaging and procedures -radiation seeds placement in the prostate, obtaining and reviewing separately obtained history, communicating with the patient, ordering medications, tests or procedures, and documenting clinical information in the EHR including the differential diagnosis, treatment, and any further evaluation and other management of prostate cancer, BPH symptoms, ED, stress at work, dyslipidemia.         Lab Results  Component Value Date   WBC 4.8 05/03/2023   HGB 15.3 05/03/2023   HCT 44.2 05/03/2023   PLT 259 05/03/2023   GLUCOSE 106 (H) 05/03/2023   CHOL 327 (H) 12/08/2022   TRIG 261.0 (H) 12/08/2022   HDL 66.60 12/08/2022   LDLDIRECT 161.0 10/01/2020   LDLCALC 208 (H) 12/08/2022   ALT 98 (H) 12/08/2022   AST 87 (H) 12/08/2022   NA 133 (L) 05/03/2023   K 3.8 05/03/2023   CL 95 (L) 05/03/2023   CREATININE 0.96 05/03/2023   BUN 18 05/03/2023    CO2 25 05/03/2023   TSH 1.52 12/08/2022   PSA 28.79 (H) 06/24/2020   HGBA1C 6.2 06/24/2020    No results found.  Assessment & Plan:   Problem List Items Addressed This Visit     Coronary artery disease   Coronary calcium score of 62.4. This was 22 percentile for age and sex matched control. Pravastatin  and Zetia       Relevant Medications  tadalafil (CIALIS) 5 MG tablet   Dyslipidemia   Coronary calcium score of 62.4.  On pravastatin  and Zetia       Essential hypertension   BP ok at home Cont on Triamt/HCTZ and amlodipine       Relevant Medications   tadalafil (CIALIS) 5 MG tablet   Prostate cancer (HCC) - Primary   S/p XRT seeds in 05/2023 for prostate cancer by Dr. Cam and Dr. Patrcia Craw ED FMLA - work no more than 60h/wk      Vitamin D  deficiency   On Vit D      Well adult exam   We discussed age appropriate health related issues, including available/recomended screening tests and vaccinations. We discussed a need for adhering to healthy diet and exercise. Labs were ordered to be later reviewed . All questions were answered. Coronary calcium score of 62.4. This was 22 percentile for age and sex matched control. Exam once a year.  Dental exam twice a year Will discuss Cologuard versus colonoscopy      Relevant Orders   TSH   Urinalysis   CBC with Differential/Platelet   Lipid panel   Comprehensive metabolic panel with GFR      Meds ordered this encounter  Medications   tadalafil (CIALIS) 5 MG tablet    Sig: Take 1 tablet (5 mg total) by mouth daily.    Dispense:  90 tablet    Refill:  3      Follow-up: Return in about 6 months (around 07/04/2024) for a follow-up visit.  Marolyn Noel, MD

## 2024-01-04 NOTE — Assessment & Plan Note (Signed)
BP ok at home Cont on Triamt/HCTZ and amlodipine

## 2024-01-04 NOTE — Assessment & Plan Note (Signed)
 On Vit D

## 2024-01-04 NOTE — Assessment & Plan Note (Signed)
 We discussed age appropriate health related issues, including available/recomended screening tests and vaccinations. We discussed a need for adhering to healthy diet and exercise. Labs were ordered to be later reviewed . All questions were answered. Coronary calcium score of 62.4. This was 25 percentile for age and sex matched control. Exam once a year.  Dental exam twice a year Will discuss Cologuard versus colonoscopy

## 2024-01-04 NOTE — Assessment & Plan Note (Signed)
 Coronary calcium score of 62.4.  On pravastatin  and Zetia 

## 2024-01-04 NOTE — Assessment & Plan Note (Signed)
 Coronary calcium score of 62.4. This was 40 percentile for age and sex matched control. Pravastatin  and Zetia 

## 2024-01-04 NOTE — Assessment & Plan Note (Addendum)
 S/p XRT seeds in 05/2023 for prostate cancer by Dr. Cam and Dr. Patrcia Craw ED FMLA - work no more than 60h/wk

## 2024-01-08 ENCOUNTER — Ambulatory Visit: Payer: Self-pay | Admitting: Internal Medicine

## 2024-01-12 ENCOUNTER — Other Ambulatory Visit: Payer: Self-pay | Admitting: Internal Medicine

## 2024-01-12 DIAGNOSIS — E785 Hyperlipidemia, unspecified: Secondary | ICD-10-CM

## 2024-01-12 DIAGNOSIS — I2510 Atherosclerotic heart disease of native coronary artery without angina pectoris: Secondary | ICD-10-CM

## 2024-01-12 MED ORDER — PRAVASTATIN SODIUM 20 MG PO TABS: 20.0000 mg | ORAL_TABLET | Freq: Every day | ORAL | 3 refills | Status: AC

## 2024-01-12 MED ORDER — ICOSAPENT ETHYL 1 G PO CAPS: 2.0000 g | ORAL_CAPSULE | Freq: Two times a day (BID) | ORAL | 3 refills | Status: AC

## 2024-01-12 MED ORDER — TAMSULOSIN HCL 0.4 MG PO CAPS: 0.4000 mg | ORAL_CAPSULE | Freq: Every day | ORAL | 3 refills | Status: AC

## 2024-02-10 ENCOUNTER — Other Ambulatory Visit: Payer: Self-pay | Admitting: Internal Medicine
